# Patient Record
Sex: Male | Born: 1935 | Hispanic: No | Smoking: Former smoker
Health system: Southern US, Community
[De-identification: ages and names within clinical notes are randomized; demographics above are authoritative.]

## PROBLEM LIST (undated history)

## (undated) DIAGNOSIS — J449 Chronic obstructive pulmonary disease, unspecified: Secondary | ICD-10-CM

## (undated) DIAGNOSIS — E119 Type 2 diabetes mellitus without complications: Secondary | ICD-10-CM

## (undated) HISTORY — PX: FOOT SURGERY: SHX648

---

## 2005-09-30 ENCOUNTER — Ambulatory Visit: Payer: Self-pay | Admitting: Internal Medicine

## 2005-11-03 ENCOUNTER — Emergency Department: Payer: Self-pay | Admitting: Emergency Medicine

## 2005-12-18 ENCOUNTER — Inpatient Hospital Stay: Payer: Self-pay | Admitting: Internal Medicine

## 2005-12-18 ENCOUNTER — Other Ambulatory Visit: Payer: Self-pay

## 2006-03-02 ENCOUNTER — Ambulatory Visit: Payer: Self-pay | Admitting: Oncology

## 2006-03-18 ENCOUNTER — Ambulatory Visit (HOSPITAL_COMMUNITY): Admission: RE | Admit: 2006-03-18 | Discharge: 2006-03-18 | Payer: Self-pay | Admitting: Internal Medicine

## 2006-03-26 LAB — CBC WITH DIFFERENTIAL (CANCER CENTER ONLY)
HGB: 14.8 g/dL (ref 13.0–17.1)
LYMPH#: 1 10*3/uL (ref 0.9–3.3)
MCHC: 34.9 g/dL (ref 32.0–35.9)
MCV: 101 fL — ABNORMAL HIGH (ref 82–98)
MONO#: 0.9 10*3/uL (ref 0.1–0.9)
MONO%: 6.7 % (ref 0.0–13.0)
NEUT%: 84.3 % — ABNORMAL HIGH (ref 40.0–80.0)
Platelets: 111 10*3/uL — ABNORMAL LOW (ref 145–400)
RDW: 11.8 % (ref 10.5–14.6)
WBC: 14.1 10*3/uL — ABNORMAL HIGH (ref 4.0–10.0)

## 2006-04-02 LAB — CBC WITH DIFFERENTIAL (CANCER CENTER ONLY)
Eosinophils Absolute: 0.1 10*3/uL (ref 0.0–0.5)
MONO#: 0.5 10*3/uL (ref 0.1–0.9)
MONO%: 5.4 % (ref 0.0–13.0)
NEUT#: 8.1 10*3/uL — ABNORMAL HIGH (ref 1.5–6.5)
Platelets: 134 10*3/uL — ABNORMAL LOW (ref 145–400)
RDW: 12.5 % (ref 10.5–14.6)
WBC: 9.7 10*3/uL (ref 4.0–10.0)

## 2006-04-09 LAB — CBC WITH DIFFERENTIAL (CANCER CENTER ONLY)
BASO#: 0.1 10*3/uL (ref 0.0–0.2)
BASO%: 0.5 % (ref 0.0–2.0)
HCT: 40.2 % (ref 38.7–49.9)
HGB: 13.4 g/dL (ref 13.0–17.1)
LYMPH#: 0.7 10*3/uL — ABNORMAL LOW (ref 0.9–3.3)
LYMPH%: 6.6 % — ABNORMAL LOW (ref 14.0–48.0)
MCH: 33.7 pg — ABNORMAL HIGH (ref 28.0–33.4)
MCHC: 33.4 g/dL (ref 32.0–35.9)
MCV: 101 fL — ABNORMAL HIGH (ref 82–98)
MONO#: 0.5 10*3/uL (ref 0.1–0.9)
MONO%: 4.6 % (ref 0.0–13.0)
NEUT%: 87 % — ABNORMAL HIGH (ref 40.0–80.0)
Platelets: 95 10*3/uL — ABNORMAL LOW (ref 145–400)
RBC: 3.99 10*6/uL — ABNORMAL LOW (ref 4.20–5.70)
RDW: 12.8 % (ref 10.5–14.6)
WBC: 10.9 10*3/uL — ABNORMAL HIGH (ref 4.0–10.0)

## 2006-04-15 ENCOUNTER — Ambulatory Visit: Payer: Self-pay | Admitting: Oncology

## 2006-04-16 LAB — CBC WITH DIFFERENTIAL (CANCER CENTER ONLY)
BASO%: 1.1 % (ref 0.0–2.0)
MCHC: 34.1 g/dL (ref 32.0–35.9)
MCV: 101 fL — ABNORMAL HIGH (ref 82–98)
MONO#: 0.9 10*3/uL (ref 0.1–0.9)
NEUT#: 7 10*3/uL — ABNORMAL HIGH (ref 1.5–6.5)
NEUT%: 70.2 % (ref 40.0–80.0)

## 2006-04-23 LAB — CBC WITH DIFFERENTIAL (CANCER CENTER ONLY)
EOS%: 1.2 % (ref 0.0–7.0)
HCT: 38.7 % (ref 38.7–49.9)
HGB: 13.1 g/dL (ref 13.0–17.1)
MCH: 34.1 pg — ABNORMAL HIGH (ref 28.0–33.4)
MCHC: 33.7 g/dL (ref 32.0–35.9)
MCV: 101 fL — ABNORMAL HIGH (ref 82–98)
MONO#: 0.3 10*3/uL (ref 0.1–0.9)
NEUT#: 6.7 10*3/uL — ABNORMAL HIGH (ref 1.5–6.5)
NEUT%: 81.4 % — ABNORMAL HIGH (ref 40.0–80.0)
RDW: 12.3 % (ref 10.5–14.6)
WBC: 8.2 10*3/uL (ref 4.0–10.0)

## 2006-04-30 LAB — CBC WITH DIFFERENTIAL (CANCER CENTER ONLY)
Eosinophils Absolute: 0.1 10*3/uL (ref 0.0–0.5)
HGB: 12.8 g/dL — ABNORMAL LOW (ref 13.0–17.1)
LYMPH#: 1.1 10*3/uL (ref 0.9–3.3)
LYMPH%: 17 % (ref 14.0–48.0)
MONO#: 0.3 10*3/uL (ref 0.1–0.9)
MONO%: 3.8 % (ref 0.0–13.0)
RDW: 12.5 % (ref 10.5–14.6)
WBC: 6.6 10*3/uL (ref 4.0–10.0)

## 2006-04-30 LAB — IRON AND TIBC
Iron: 92 ug/dL (ref 42–165)
UIBC: 178 ug/dL

## 2006-04-30 LAB — FERRITIN: Ferritin: 279 ng/mL (ref 22–322)

## 2006-04-30 LAB — VITAMIN B12: Vitamin B-12: 558 pg/mL (ref 211–911)

## 2006-05-07 LAB — CBC WITH DIFFERENTIAL (CANCER CENTER ONLY)
BASO#: 0.1 10*3/uL (ref 0.0–0.2)
EOS%: 0.9 % (ref 0.0–7.0)
Eosinophils Absolute: 0.1 10*3/uL (ref 0.0–0.5)
HCT: 41 % (ref 38.7–49.9)
LYMPH%: 8.7 % — ABNORMAL LOW (ref 14.0–48.0)
MCH: 34 pg — ABNORMAL HIGH (ref 28.0–33.4)
MCV: 100 fL — ABNORMAL HIGH (ref 82–98)
NEUT#: 8.5 10*3/uL — ABNORMAL HIGH (ref 1.5–6.5)
RDW: 12 % (ref 10.5–14.6)

## 2006-05-14 LAB — CBC WITH DIFFERENTIAL (CANCER CENTER ONLY)
EOS%: 1.2 % (ref 0.0–7.0)
Eosinophils Absolute: 0.1 10*3/uL (ref 0.0–0.5)
HCT: 41.3 % (ref 38.7–49.9)
MCHC: 33.2 g/dL (ref 32.0–35.9)
MONO#: 0.3 10*3/uL (ref 0.1–0.9)
NEUT#: 9 10*3/uL — ABNORMAL HIGH (ref 1.5–6.5)
WBC: 10.4 10*3/uL — ABNORMAL HIGH (ref 4.0–10.0)

## 2006-05-21 LAB — CBC WITH DIFFERENTIAL (CANCER CENTER ONLY)
BASO#: 0 10*3/uL (ref 0.0–0.2)
BASO%: 0.4 % (ref 0.0–2.0)
EOS%: 1 % (ref 0.0–7.0)
Eosinophils Absolute: 0.1 10*3/uL (ref 0.0–0.5)
LYMPH#: 0.7 10*3/uL — ABNORMAL LOW (ref 0.9–3.3)
MCH: 34.7 pg — ABNORMAL HIGH (ref 28.0–33.4)
MCHC: 33.3 g/dL (ref 32.0–35.9)
MCV: 104 fL — ABNORMAL HIGH (ref 82–98)
MONO%: 3.2 % (ref 0.0–13.0)
NEUT%: 87.8 % — ABNORMAL HIGH (ref 40.0–80.0)

## 2006-05-28 LAB — CBC WITH DIFFERENTIAL (CANCER CENTER ONLY)
BASO#: 0.1 10e3/uL (ref 0.0–0.2)
BASO%: 0.5 % (ref 0.0–2.0)
EOS%: 1.1 % (ref 0.0–7.0)
Eosinophils Absolute: 0.1 10e3/uL (ref 0.0–0.5)
HCT: 42.1 % (ref 38.7–49.9)
HGB: 13.8 g/dL (ref 13.0–17.1)
LYMPH#: 0.9 10e3/uL (ref 0.9–3.3)
LYMPH%: 8.5 % — ABNORMAL LOW (ref 14.0–48.0)
MCH: 33.1 pg (ref 28.0–33.4)
MCHC: 32.9 g/dL (ref 32.0–35.9)
MCV: 100 fL — ABNORMAL HIGH (ref 82–98)
MONO#: 0.5 10e3/uL (ref 0.1–0.9)
MONO%: 4.9 % (ref 0.0–13.0)
NEUT#: 9.1 10e3/uL — ABNORMAL HIGH (ref 1.5–6.5)
NEUT%: 85 % — ABNORMAL HIGH (ref 40.0–80.0)
Platelets: 152 10e3/uL (ref 145–400)
RBC: 4.19 10e6/uL — ABNORMAL LOW (ref 4.20–5.70)
RDW: 11.9 % (ref 10.5–14.6)
WBC: 10.7 10e3/uL — ABNORMAL HIGH (ref 4.0–10.0)

## 2006-06-03 ENCOUNTER — Ambulatory Visit: Payer: Self-pay | Admitting: Oncology

## 2006-06-04 LAB — CBC WITH DIFFERENTIAL (CANCER CENTER ONLY)
BASO#: 0 10e3/uL (ref 0.0–0.2)
BASO%: 0.4 % (ref 0.0–2.0)
EOS%: 1.2 % (ref 0.0–7.0)
Eosinophils Absolute: 0.1 10e3/uL (ref 0.0–0.5)
HCT: 40.4 % (ref 38.7–49.9)
HGB: 13.4 g/dL (ref 13.0–17.1)
LYMPH#: 0.7 10e3/uL — ABNORMAL LOW (ref 0.9–3.3)
LYMPH%: 8.1 % — ABNORMAL LOW (ref 14.0–48.0)
MCH: 32.9 pg (ref 28.0–33.4)
MCHC: 33.1 g/dL (ref 32.0–35.9)
MCV: 99 fL — ABNORMAL HIGH (ref 82–98)
MONO#: 0.4 10e3/uL (ref 0.1–0.9)
MONO%: 4.3 % (ref 0.0–13.0)
NEUT#: 7.4 10e3/uL — ABNORMAL HIGH (ref 1.5–6.5)
NEUT%: 86 % — ABNORMAL HIGH (ref 40.0–80.0)
Platelets: 163 10e3/uL (ref 145–400)
RBC: 4.06 10e6/uL — ABNORMAL LOW (ref 4.20–5.70)
RDW: 12.3 % (ref 10.5–14.6)
WBC: 8.6 10e3/uL (ref 4.0–10.0)

## 2006-06-12 LAB — CBC WITH DIFFERENTIAL (CANCER CENTER ONLY)
BASO#: 0 10*3/uL (ref 0.0–0.2)
Eosinophils Absolute: 0.1 10*3/uL (ref 0.0–0.5)
HCT: 40 % (ref 38.7–49.9)
LYMPH%: 8.5 % — ABNORMAL LOW (ref 14.0–48.0)
MCHC: 33.1 g/dL (ref 32.0–35.9)
MCV: 99 fL — ABNORMAL HIGH (ref 82–98)
MONO#: 0.4 10*3/uL (ref 0.1–0.9)
MONO%: 4.7 % (ref 0.0–13.0)
NEUT#: 6.7 10*3/uL — ABNORMAL HIGH (ref 1.5–6.5)
Platelets: 174 10*3/uL (ref 145–400)
RDW: 11.4 % (ref 10.5–14.6)
WBC: 7.8 10*3/uL (ref 4.0–10.0)

## 2006-06-13 LAB — IRON AND TIBC: UIBC: 222 ug/dL

## 2006-06-19 LAB — CBC WITH DIFFERENTIAL (CANCER CENTER ONLY)
BASO#: 0.1 10*3/uL (ref 0.0–0.2)
BASO%: 0.7 % (ref 0.0–2.0)
EOS%: 2 % (ref 0.0–7.0)
Eosinophils Absolute: 0.2 10*3/uL (ref 0.0–0.5)
HCT: 39.9 % (ref 38.7–49.9)
HGB: 13.5 g/dL (ref 13.0–17.1)
MCV: 98 fL (ref 82–98)
NEUT#: 7.6 10*3/uL — ABNORMAL HIGH (ref 1.5–6.5)
Platelets: 181 10*3/uL (ref 145–400)
RBC: 4.06 10*6/uL — ABNORMAL LOW (ref 4.20–5.70)
RDW: 12.2 % (ref 10.5–14.6)
WBC: 9.8 10*3/uL (ref 4.0–10.0)

## 2006-07-02 LAB — CBC WITH DIFFERENTIAL (CANCER CENTER ONLY)
Eosinophils Absolute: 0.2 10*3/uL (ref 0.0–0.5)
HCT: 39.6 % (ref 38.7–49.9)
LYMPH#: 0.8 10*3/uL — ABNORMAL LOW (ref 0.9–3.3)
MONO#: 0.5 10*3/uL (ref 0.1–0.9)
Platelets: 174 10*3/uL (ref 145–400)

## 2006-07-09 LAB — CBC WITH DIFFERENTIAL (CANCER CENTER ONLY)
BASO#: 0.1 10*3/uL (ref 0.0–0.2)
EOS%: 1.1 % (ref 0.0–7.0)
HCT: 38.5 % — ABNORMAL LOW (ref 38.7–49.9)
HGB: 12.8 g/dL — ABNORMAL LOW (ref 13.0–17.1)
MCH: 31.4 pg (ref 28.0–33.4)
MCV: 95 fL (ref 82–98)
RBC: 4.07 10*6/uL — ABNORMAL LOW (ref 4.20–5.70)

## 2006-07-16 LAB — CBC WITH DIFFERENTIAL (CANCER CENTER ONLY)
BASO#: 0.1 10*3/uL (ref 0.0–0.2)
BASO%: 0.9 % (ref 0.0–2.0)
LYMPH#: 1 10*3/uL (ref 0.9–3.3)
MCHC: 33.2 g/dL (ref 32.0–35.9)
MCV: 93 fL (ref 82–98)
MONO#: 0.4 10*3/uL (ref 0.1–0.9)
MONO%: 3.5 % (ref 0.0–13.0)
NEUT#: 9.7 10*3/uL — ABNORMAL HIGH (ref 1.5–6.5)
Platelets: 187 10*3/uL (ref 145–400)
RBC: 4 10*6/uL — ABNORMAL LOW (ref 4.20–5.70)
RDW: 12.5 % (ref 10.5–14.6)
WBC: 11.4 10*3/uL — ABNORMAL HIGH (ref 4.0–10.0)

## 2006-07-21 ENCOUNTER — Ambulatory Visit: Payer: Self-pay | Admitting: Oncology

## 2006-07-23 LAB — CBC WITH DIFFERENTIAL (CANCER CENTER ONLY)
BASO#: 0.1 10*3/uL (ref 0.0–0.2)
EOS%: 1.2 % (ref 0.0–7.0)
HCT: 34.4 % — ABNORMAL LOW (ref 38.7–49.9)
LYMPH#: 0.6 10*3/uL — ABNORMAL LOW (ref 0.9–3.3)
LYMPH%: 6.9 % — ABNORMAL LOW (ref 14.0–48.0)
MCHC: 33 g/dL (ref 32.0–35.9)
NEUT#: 8.1 10*3/uL — ABNORMAL HIGH (ref 1.5–6.5)
NEUT%: 88.2 % — ABNORMAL HIGH (ref 40.0–80.0)
RBC: 3.76 10*6/uL — ABNORMAL LOW (ref 4.20–5.70)
WBC: 9.1 10*3/uL (ref 4.0–10.0)

## 2006-07-24 LAB — FERRITIN: Ferritin: 42 ng/mL (ref 22–322)

## 2006-07-30 LAB — CBC WITH DIFFERENTIAL (CANCER CENTER ONLY)
BASO%: 0.4 % (ref 0.0–2.0)
EOS%: 1.2 % (ref 0.0–7.0)
MCHC: 33 g/dL (ref 32.0–35.9)
NEUT#: 5.7 10*3/uL (ref 1.5–6.5)
NEUT%: 86.1 % — ABNORMAL HIGH (ref 40.0–80.0)
WBC: 6.6 10*3/uL (ref 4.0–10.0)

## 2006-09-08 ENCOUNTER — Ambulatory Visit: Payer: Self-pay | Admitting: Oncology

## 2006-09-10 LAB — CBC WITH DIFFERENTIAL (CANCER CENTER ONLY)
BASO#: 0 10*3/uL (ref 0.0–0.2)
EOS%: 1.2 % (ref 0.0–7.0)
Eosinophils Absolute: 0.1 10*3/uL (ref 0.0–0.5)
HGB: 11.9 g/dL — ABNORMAL LOW (ref 13.0–17.1)
LYMPH#: 0.8 10*3/uL — ABNORMAL LOW (ref 0.9–3.3)
MONO#: 0.5 10*3/uL (ref 0.1–0.9)
NEUT#: 9.1 10*3/uL — ABNORMAL HIGH (ref 1.5–6.5)
NEUT%: 86.5 % — ABNORMAL HIGH (ref 40.0–80.0)
Platelets: 171 10*3/uL (ref 145–400)
RDW: 15 % — ABNORMAL HIGH (ref 10.5–14.6)

## 2006-09-24 LAB — CBC WITH DIFFERENTIAL (CANCER CENTER ONLY)
EOS%: 1.5 % (ref 0.0–7.0)
Eosinophils Absolute: 0.1 10*3/uL (ref 0.0–0.5)
HGB: 11.7 g/dL — ABNORMAL LOW (ref 13.0–17.1)
MONO#: 0.4 10*3/uL (ref 0.1–0.9)
RDW: 14.5 % (ref 10.5–14.6)

## 2006-10-02 ENCOUNTER — Ambulatory Visit: Payer: Self-pay | Admitting: Internal Medicine

## 2006-10-12 LAB — CBC WITH DIFFERENTIAL (CANCER CENTER ONLY)
BASO%: 0.3 % (ref 0.0–2.0)
EOS%: 1.5 % (ref 0.0–7.0)
LYMPH%: 8.5 % — ABNORMAL LOW (ref 14.0–48.0)
MCH: 27.2 pg — ABNORMAL LOW (ref 28.0–33.4)
MCV: 84 fL (ref 82–98)
RBC: 4.71 10*6/uL (ref 4.20–5.70)
RDW: 15.3 % — ABNORMAL HIGH (ref 10.5–14.6)
WBC: 9.5 10*3/uL (ref 4.0–10.0)

## 2006-10-12 LAB — IRON AND TIBC: Iron: 70 ug/dL (ref 42–165)

## 2007-01-01 ENCOUNTER — Ambulatory Visit: Payer: Self-pay | Admitting: Oncology

## 2007-01-04 LAB — CBC WITH DIFFERENTIAL (CANCER CENTER ONLY)
BASO#: 0 10*3/uL (ref 0.0–0.2)
Eosinophils Absolute: 0.1 10*3/uL (ref 0.0–0.5)
HGB: 13 g/dL (ref 13.0–17.1)
MCHC: 32.8 g/dL (ref 32.0–35.9)
NEUT%: 87.1 % — ABNORMAL HIGH (ref 40.0–80.0)
Platelets: 161 10*3/uL (ref 145–400)

## 2007-01-04 LAB — IRON AND TIBC
%SAT: 13 % — ABNORMAL LOW (ref 20–55)
Iron: 42 ug/dL (ref 42–165)
TIBC: 315 ug/dL (ref 215–435)
UIBC: 273 ug/dL

## 2007-01-04 LAB — FERRITIN: Ferritin: 39 ng/mL (ref 22–322)

## 2007-02-07 ENCOUNTER — Other Ambulatory Visit: Payer: Self-pay

## 2007-02-07 ENCOUNTER — Emergency Department: Payer: Self-pay | Admitting: Internal Medicine

## 2007-03-26 ENCOUNTER — Ambulatory Visit: Payer: Self-pay | Admitting: Oncology

## 2007-09-14 IMAGING — CT CT NECK-CHEST W/ CM
1 series · 12 of 14 positions shown, 15 images · IV contrast (APPLIED)
Comparison: none

REASON FOR EXAM: weakness, chest pain
COMMENTS:

[Series 4: soft tissue · axial · 0.74mm/px · z∈[+60,+423]mm · 12 of 145 slices shown, 15 images]
[im 12/145  soft-tissue]
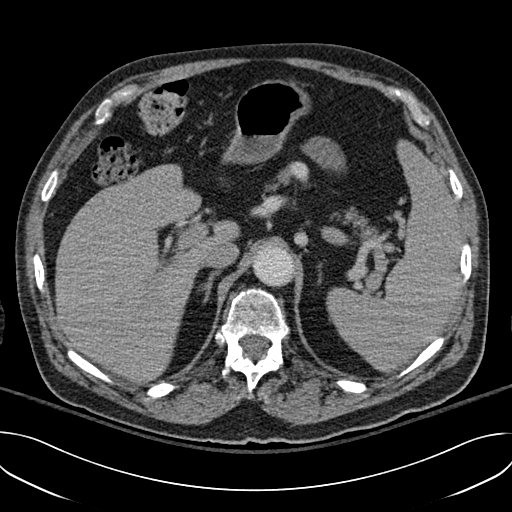
[im 12/145  bone]
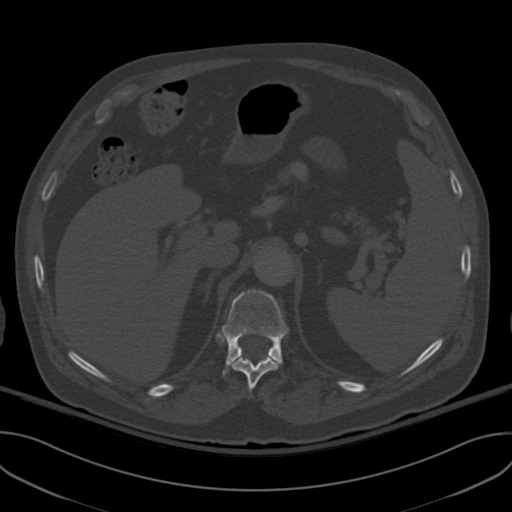
[im 23/145  bone]
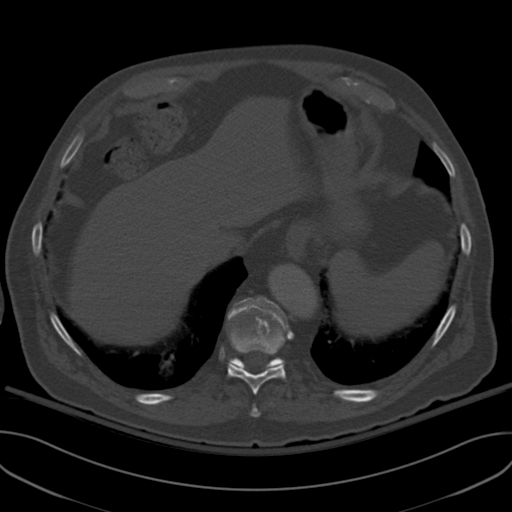
[im 34/145  bone]
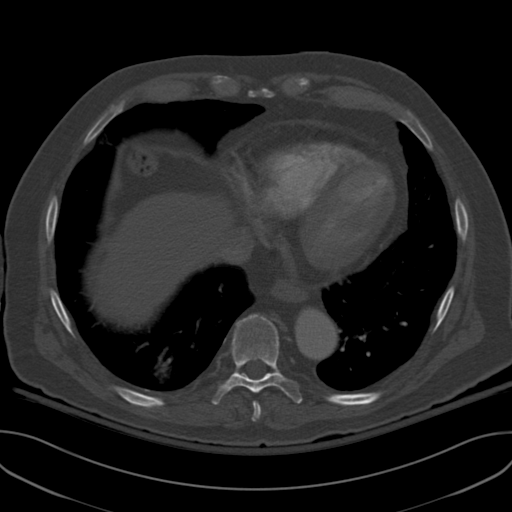
[im 45/145  bone]
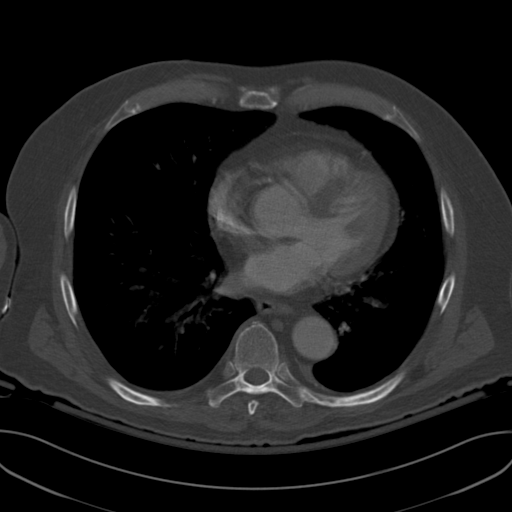
[im 56/145  soft-tissue]
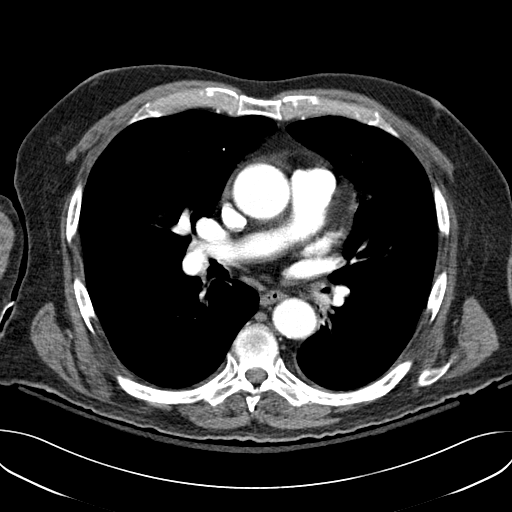
[im 56/145  bone]
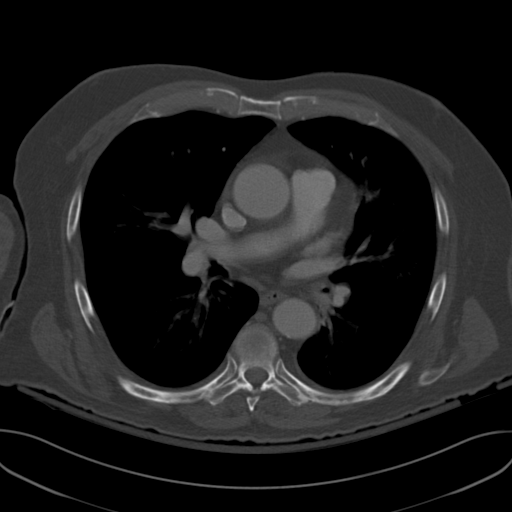
[im 67/145  bone]
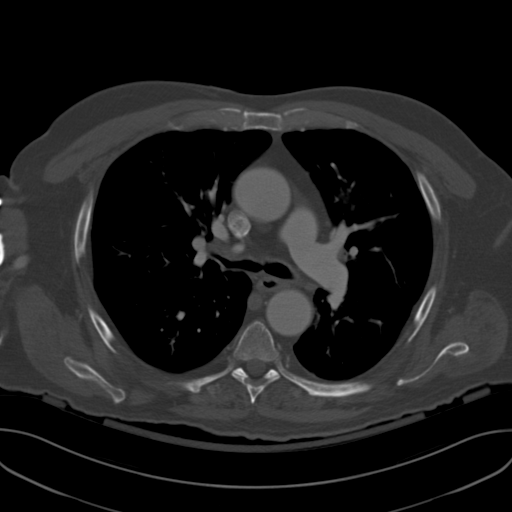
[im 78/145  bone]
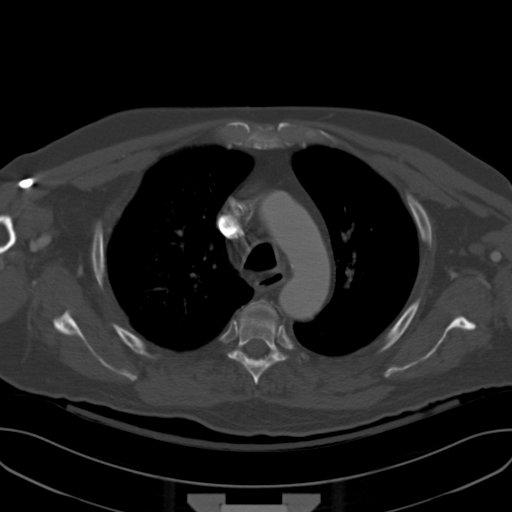
[im 89/145  bone]
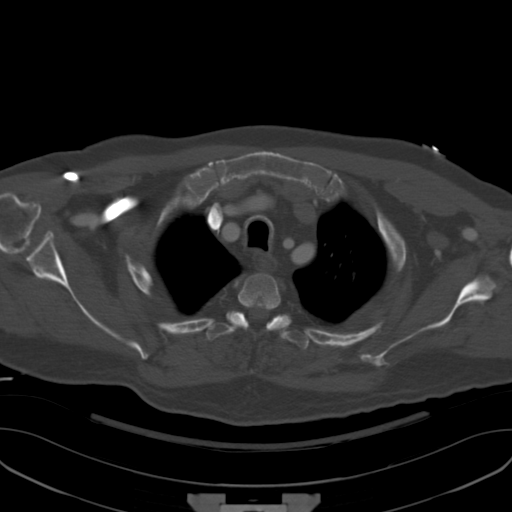
[im 100/145  soft-tissue]
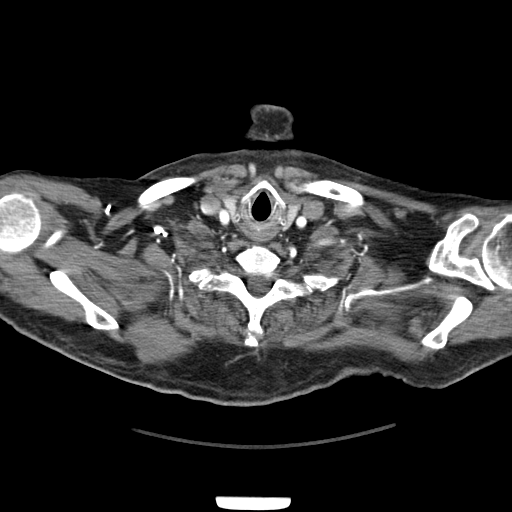
[im 100/145  bone]
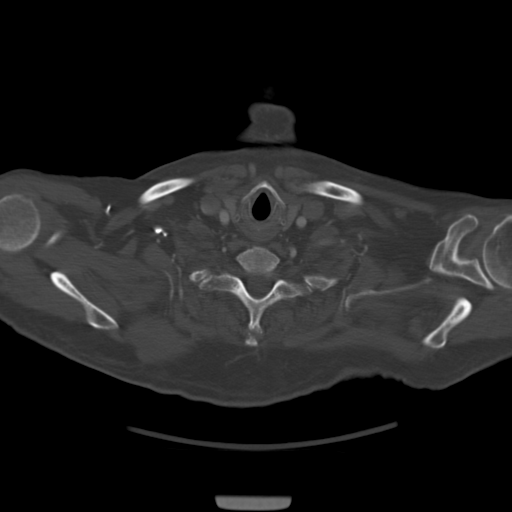
[im 111/145  bone]
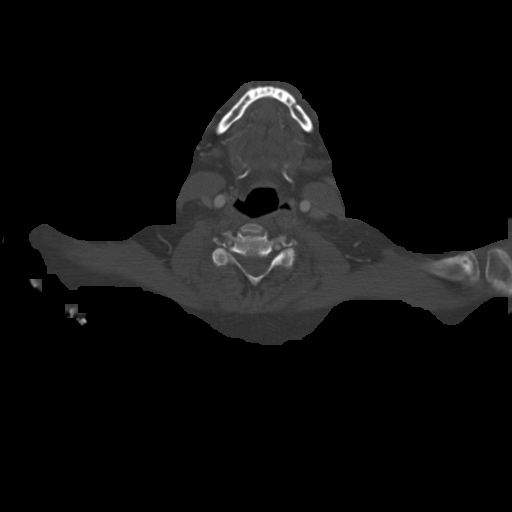
[im 122/145  bone]
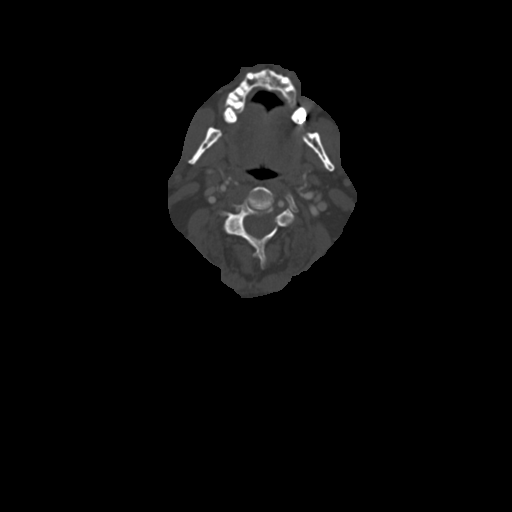
[im 133/145  bone]
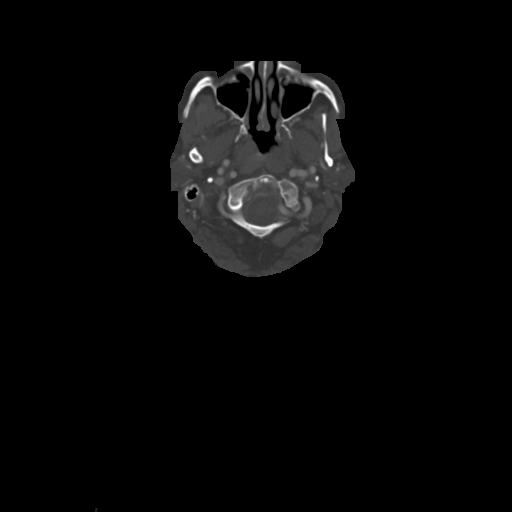

[12 of 14 positions shown; findings below may reference images not displayed]

PROCEDURE:     CT  - CT NECK AND CHEST W  - December 18, 2005  [DATE]

RESULT:

CT OF THE CHEST: CT of the chest and neck is performed. The patient
previously had an attempted chest CT for PE, but apparently the IV became
disconnected and the contrast bolus was suboptimal.  A second study
demonstrates optimal contrast opacification of the pulmonary arterial
system.  Unfortunately, there is fairly significant respiratory motion
artifact, which decreases sensitivity for evaluation for PE.  No definite
filling defects are identified within the pulmonary arteries. There are
scattered areas of atelectasis.  There is no significant effusion. There is
no pneumothorax. There is no pericardial effusion. The heart is not
enlarged. The aorta appears to be normal. There does appear to be some
fairly heavy atherosclerotic calcification in the coronary arteries.

CT SCAN OF THE NECK:  IV contrast enhanced CT scan of the neck performed at
the same time of the chest shows some degenerative changes in the cervical
spine, which could be further evaluated with plain films or MR if the
patient is symptomatic.  There is no evidence of adenopathy or focal mass.
The salivary glands appear to be unremarkable. There is some opacification
of the LEFT sphenoid sinus and posterior LEFT ethmoid air cells, which may
represent sinusitis.  The frontal and maxillary sinuses are normally
aerated.
IMPRESSION: 1)Limited sensitivity because of respiratory motion artifact.

2)No definite PE.

3)No mediastinal mass.

4)LEFT sphenoid and ethmoid sinusitis.  No other significant abnormality in
the neck aside from the degenerative changes in the cervical spine.

## 2007-09-14 IMAGING — CR DG CHEST 2V
1 series · 3 of 3 positions shown · non-contrast
Comparison: none

REASON FOR EXAM: Weakness. Rm 18
COMMENTS:  LMP: (Male)

[Series 1: view not recorded · 0.17mm/px · 3 of 3 slices shown]
[im 1/3]
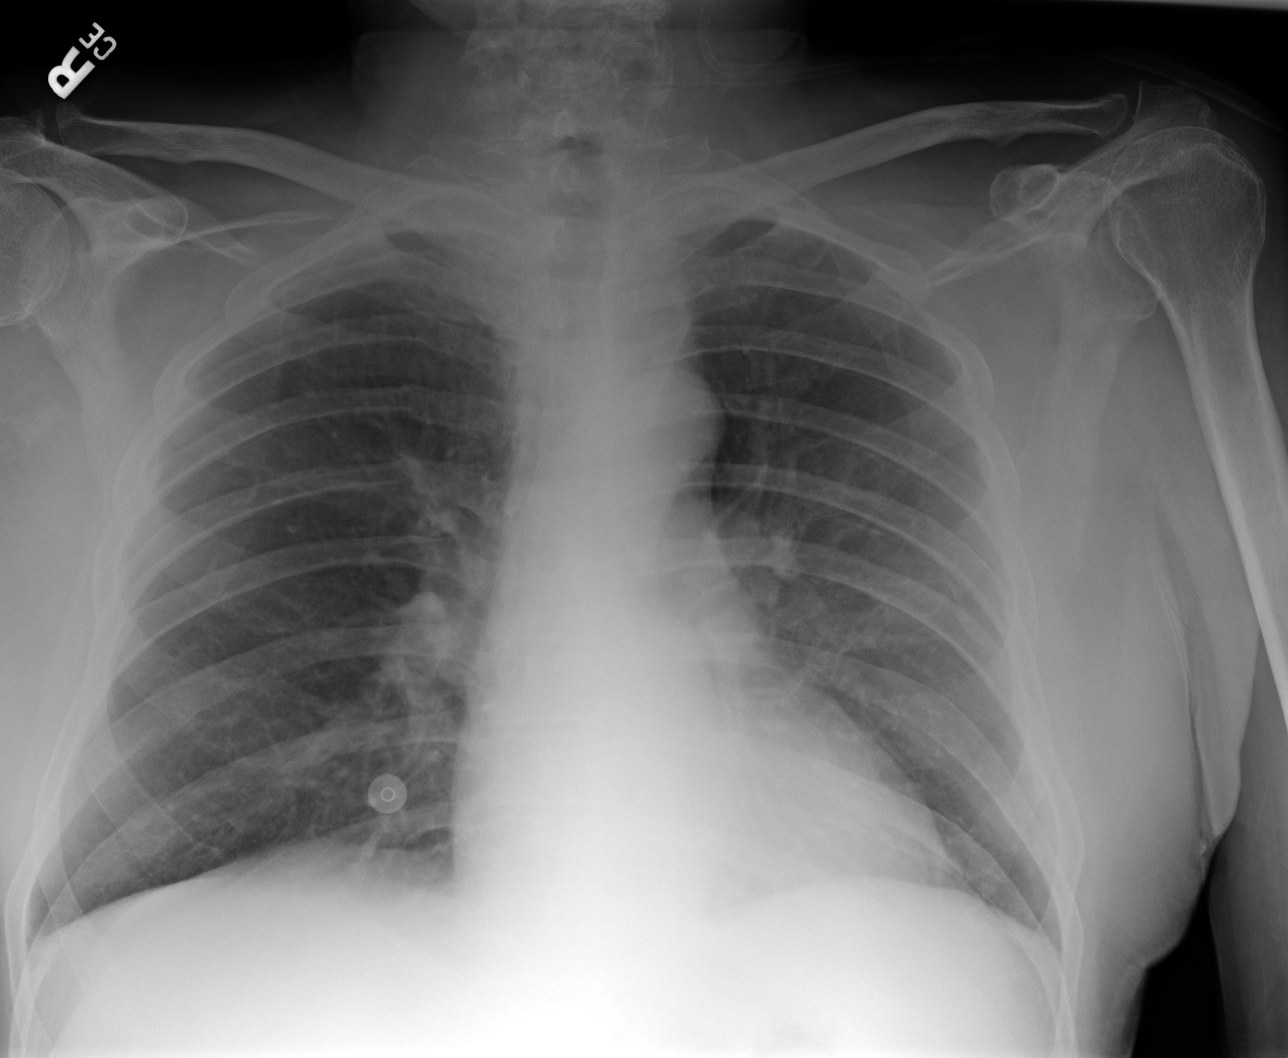
[im 2/3]
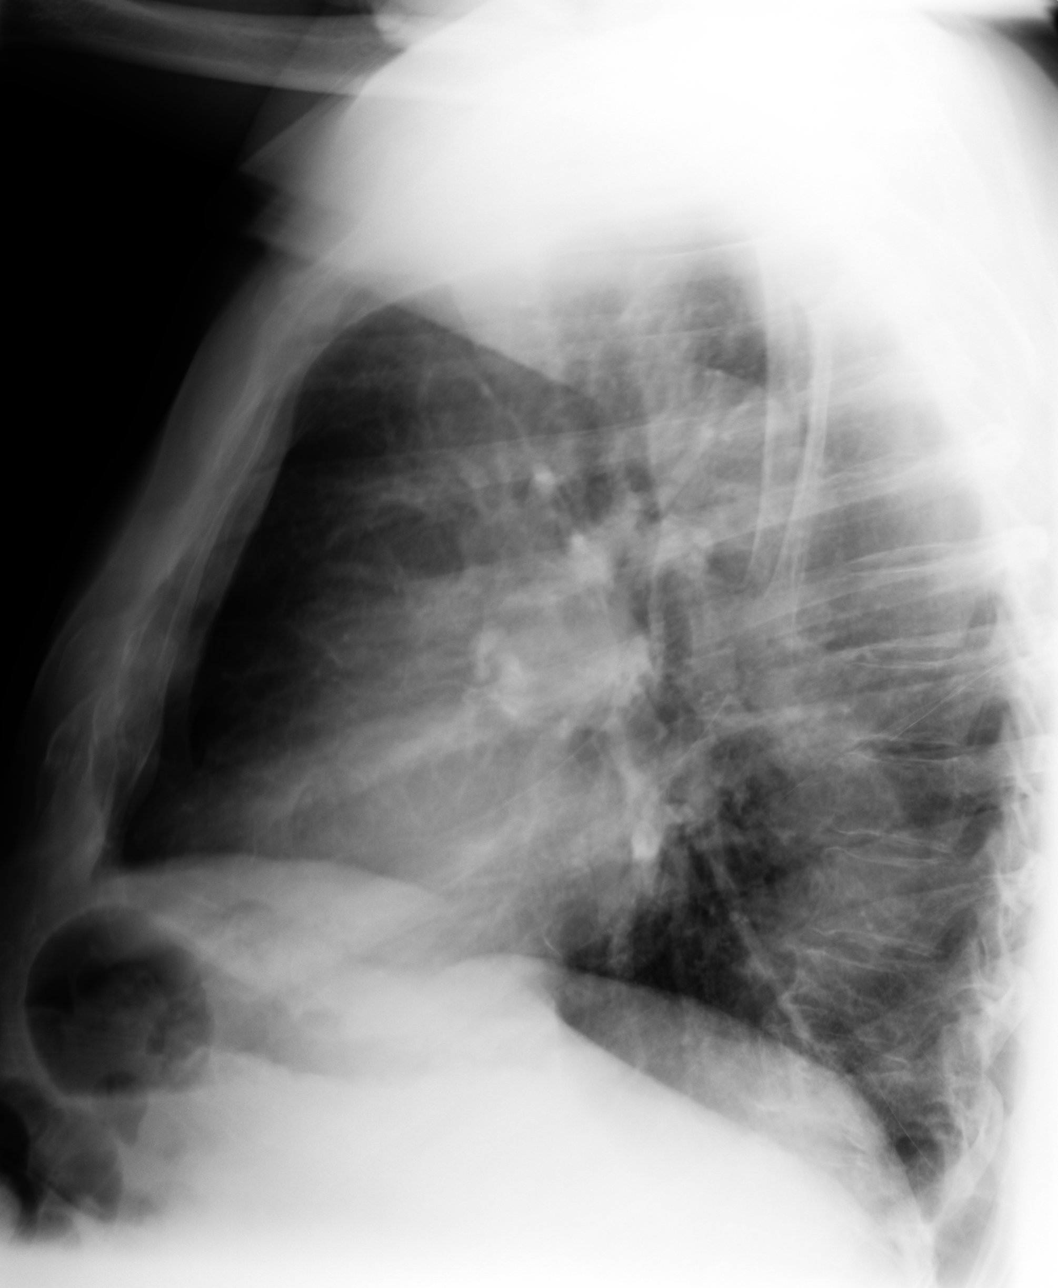
[im 3/3]
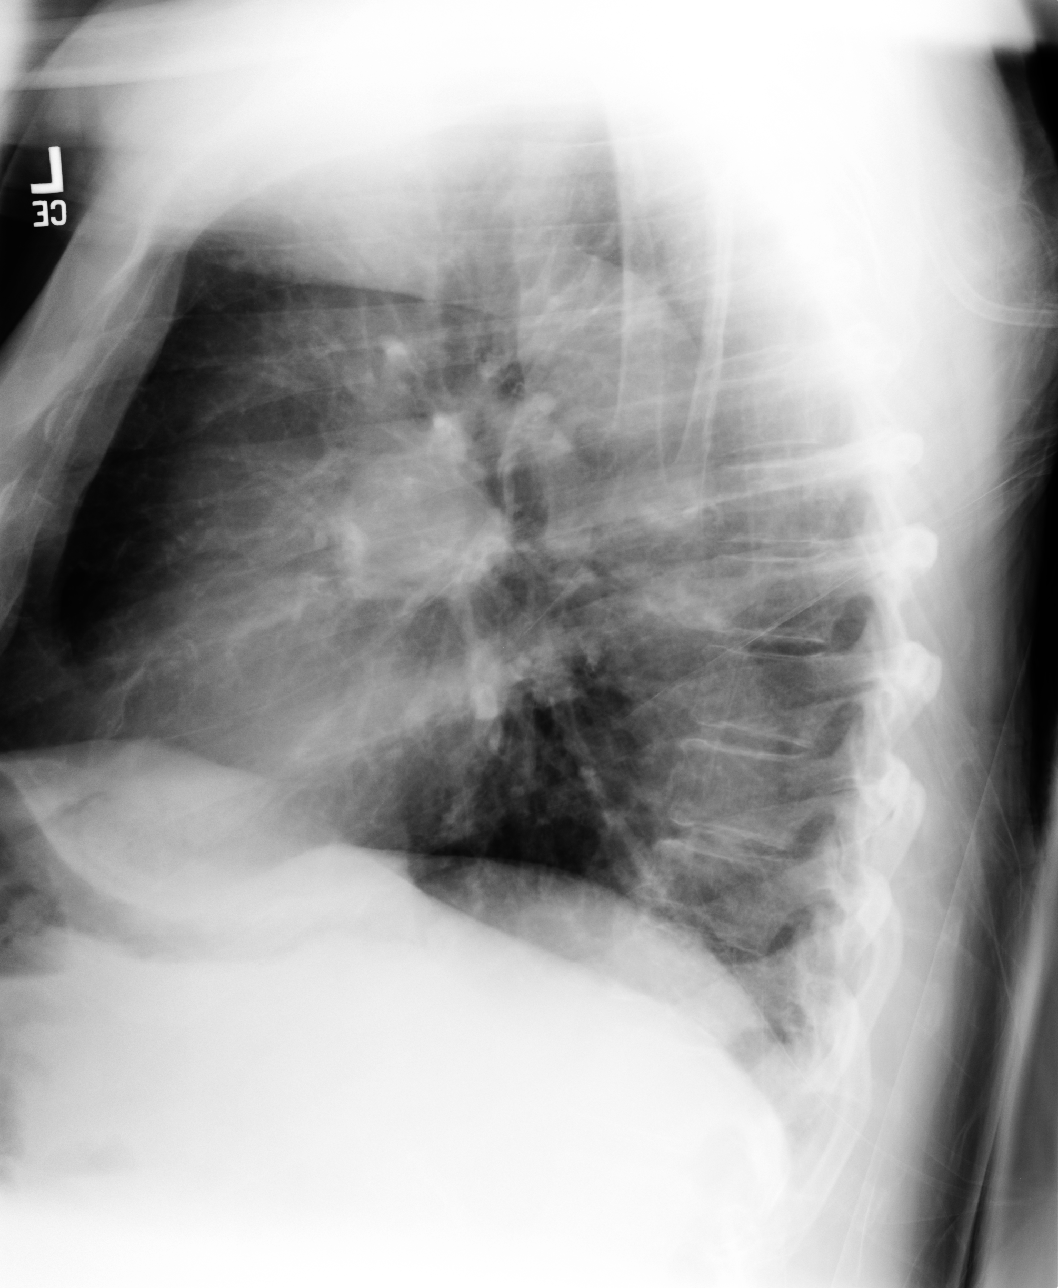

[3 of 3 positions shown; findings below may reference images not displayed]

PROCEDURE:     DXR - DXR CHEST PA (OR AP) AND LATERAL  - December 18, 2005  [DATE]

RESULT:     The patient has no prior study for comparison.  The lungs are
hyperinflated consistent with COPD.  There is no infiltrate or effusion.
There is no pneumothorax.  There is some mild hilar prominence which may be
secondary to prominent pulmonary vasculature.  Some granulomatous
calcifications in the hilar regions cannot be excluded.  Does the patient
have prior chest x-rays at another institution which can be compared?
IMPRESSION: Mild hilar prominence which may be secondary to prominent pulmonary
vasculature.  Is there a history of pulmonary hypertension?

Emphysematous changes.

No cardiomegaly.

Mild granulomatous changes likely present in the hilar region.

## 2007-12-13 IMAGING — US US ABDOMEN COMPLETE
1 series · 13 of 25 positions shown · non-contrast
Comparison: none

CLINICAL DATA: Pancreatic tail mass described on 03/04/2006 CT.   [REDACTED] also low density right renal lesion reported.  
ABDOMEN ULTRASOUND:
TECHNIQUE: Complete abdominal ultrasound examination was performed including evaluation of the liver, gallbladder, bile ducts, pancreas, kidneys, spleen, IVC, and abdominal aorta.

[Series 1: unknown · 0.35mm/px · 13 of 79 slices shown]
[im 1/79]
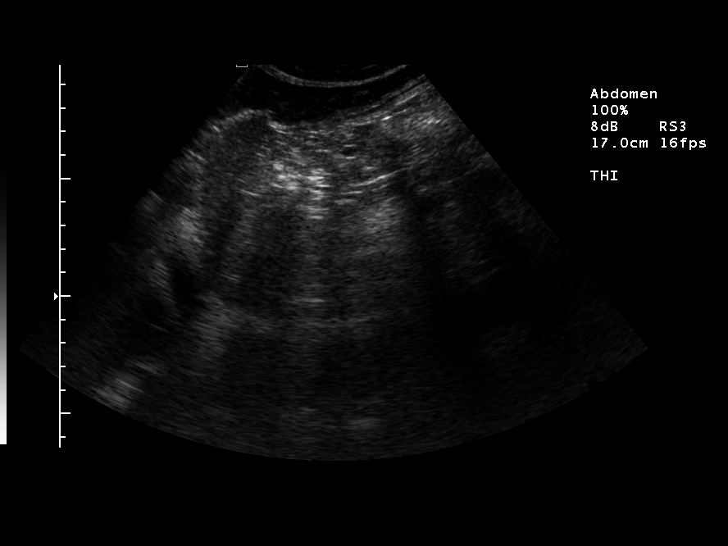
[im 7/79]
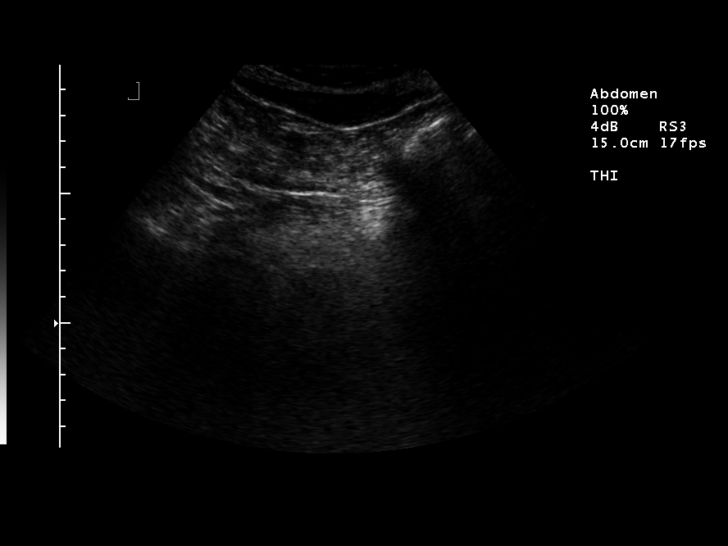
[im 14/79]
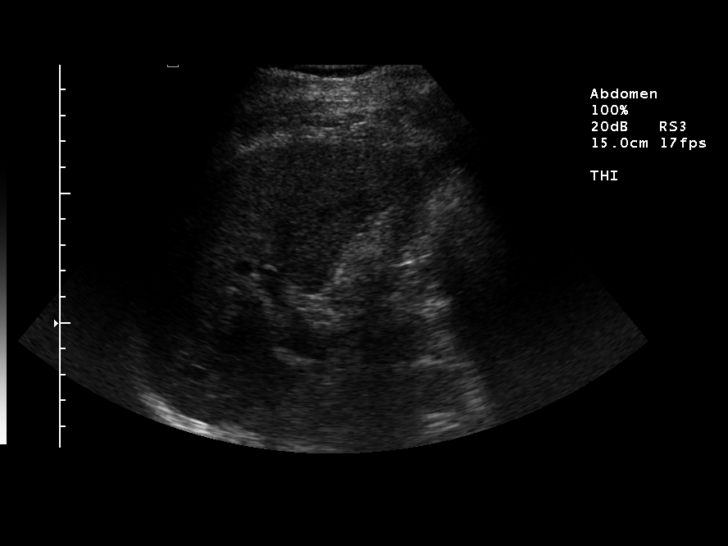
[im 20/79]
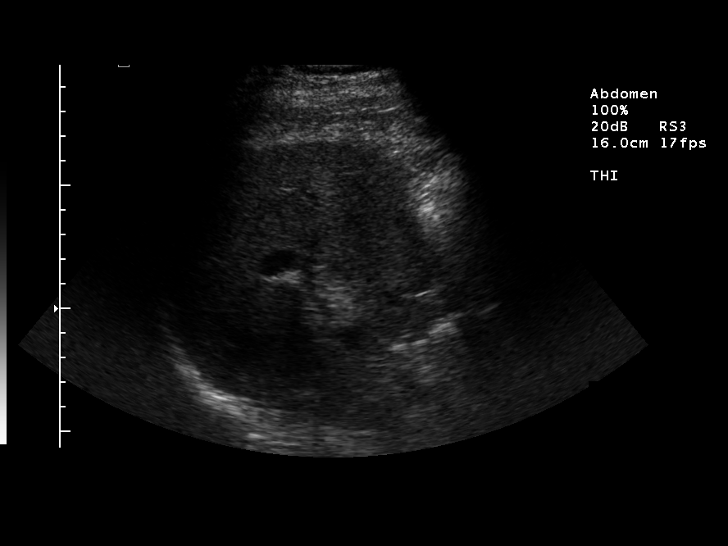
[im 27/79]
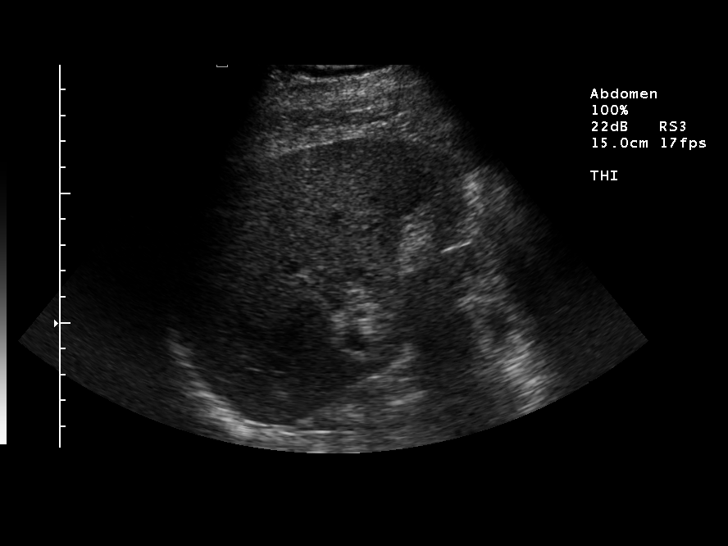
[im 33/79]
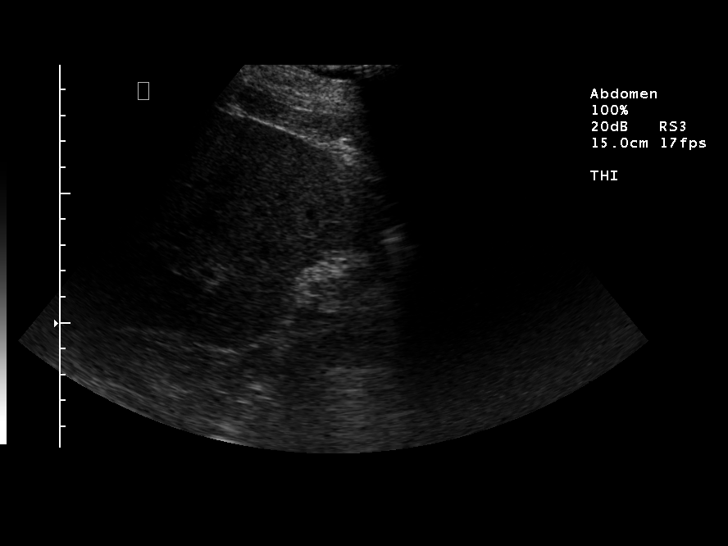
[im 40/79]
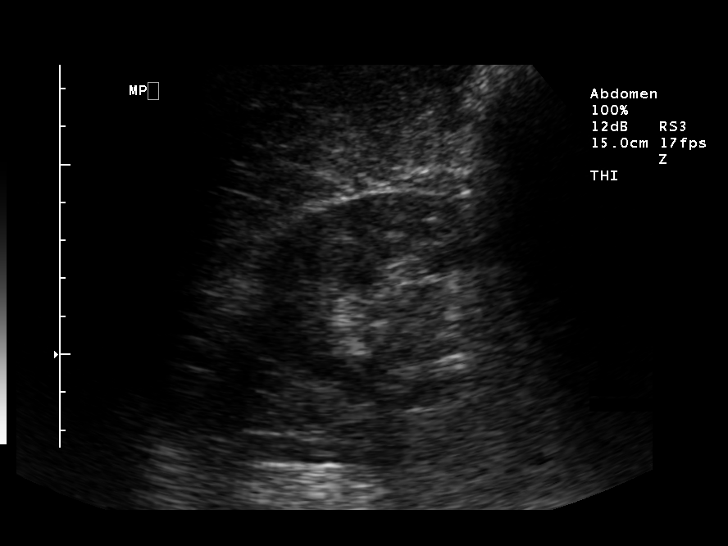
[im 46/79]
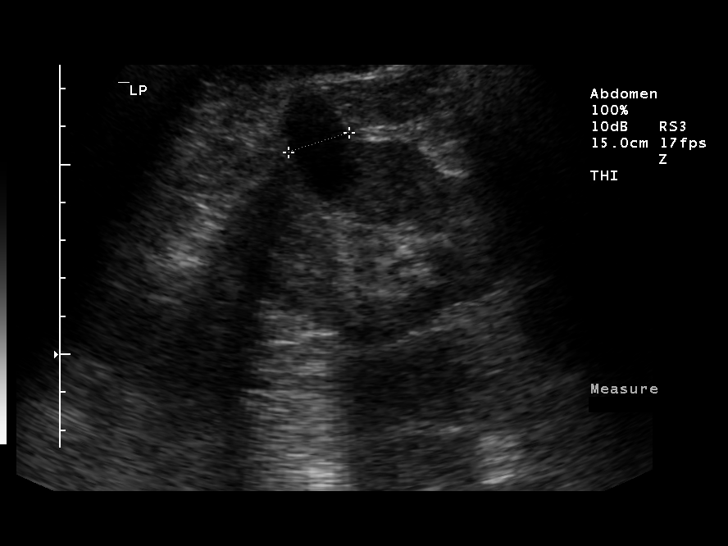
[im 53/79]
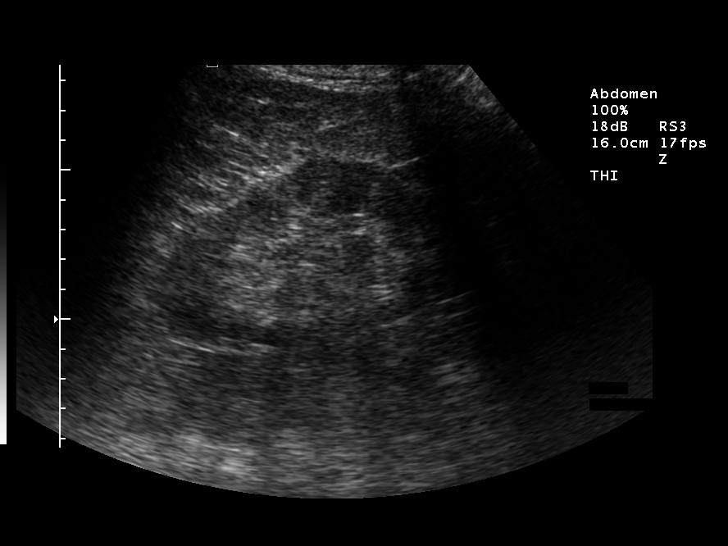
[im 59/79]
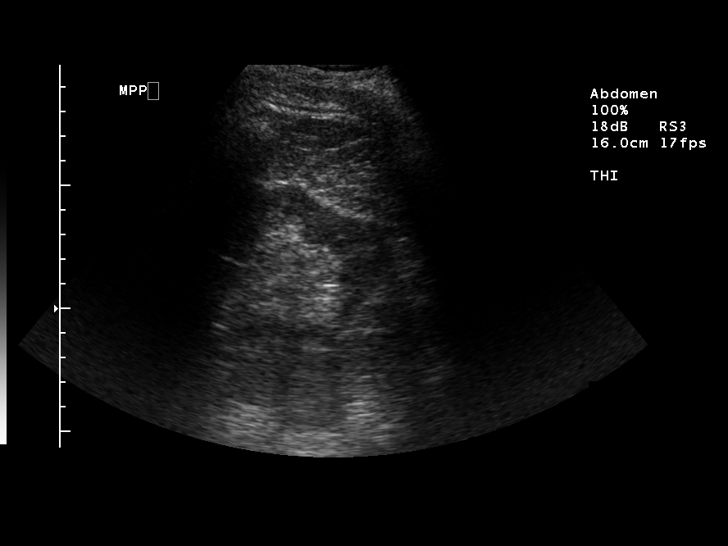
[im 66/79]
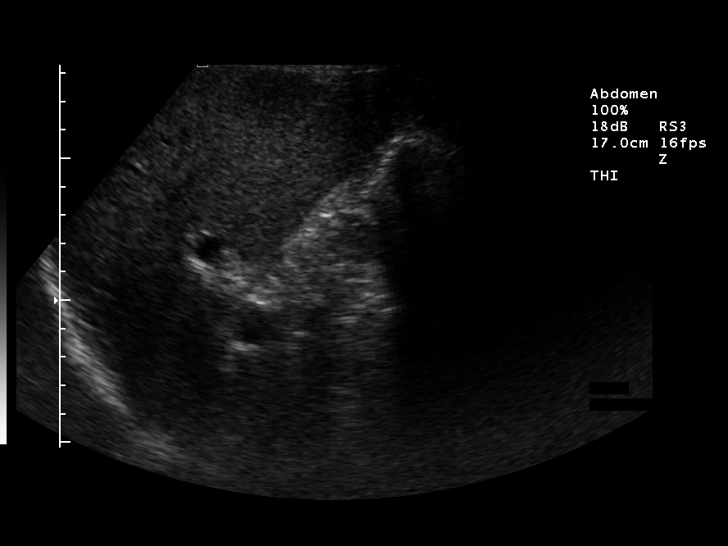
[im 72/79]
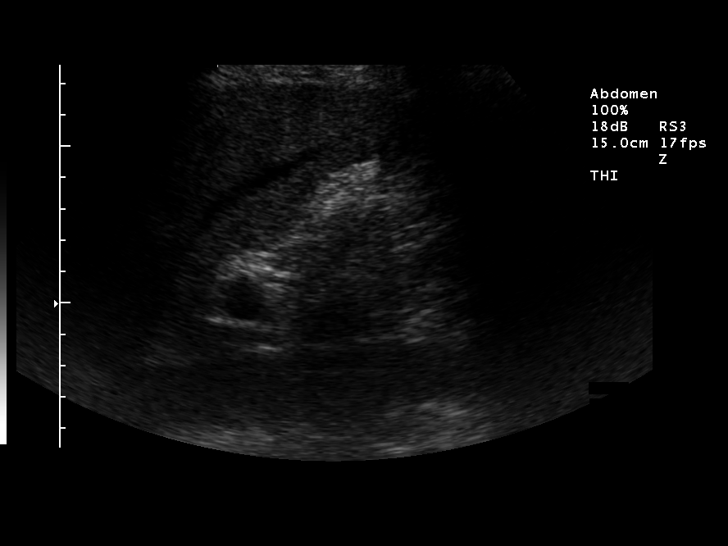
[im 79/79]
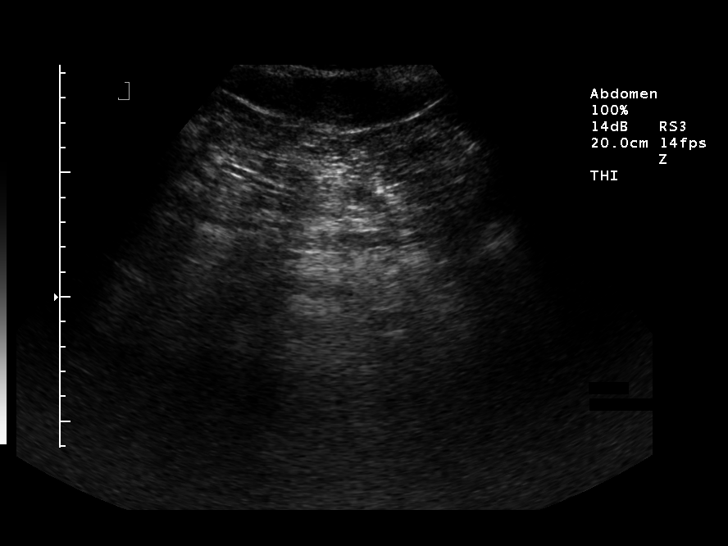

[13 of 25 positions shown; findings below may reference images not displayed]

FINDINGS: Gallbladder appears contracted and may contain stones.  Examination of the gallbladder and also of the entire abdomen is somewhat difficult in this patient due to degree of bowel gas.  Common duct caliber is 4.6 mm.  Liver measures 16.3 cm in length.  Left lobe is not well visualized due to bowel gas.  Pancreas is suboptimally visualized due to the presence of bowel gas.  On the CT scan, there was a reported 0.8 x 1.0 cm low density lesion in the pancreatic tail.  This region was obscured by adjacent bowel gas.  Stability can be assessed on followup CT.  Also reported was a 1.8 x 3.0 cm low density structure in the lateral aspect of the right kidney.  There is a cyst, I believe a slightly complex cyst at that site measuring approximately 1.5 x 1.7 x 2.9 cm.  Right and left kidneys measure 11.7 cm and 11.8 cm in length respectively.  Patent IVC.  Maximum diameter of abdominal aorta is 2.8 cm.
IMPRESSION: Retracted gallbladder probably containing stones.  Poor visualization of abdominal structures in general because of the degree of bowel gas.  Suboptimal visualization of the pancreas.   Complex right renal cyst.  In view of the fact that the mass in the tail of the pancreas cannot be visualized on ultrasound, consider followup ultrasound to reassess the pancreas and also the complex right renal cyst.

## 2013-01-15 ENCOUNTER — Emergency Department: Payer: Self-pay | Admitting: Emergency Medicine

## 2015-11-03 ENCOUNTER — Inpatient Hospital Stay
Admission: EM | Admit: 2015-11-03 | Discharge: 2015-11-10 | DRG: 192 | Disposition: A | Discharge status: Skilled Nursing Facility | Payer: Medicare Other | Attending: Internal Medicine | Admitting: Internal Medicine

## 2015-11-03 ENCOUNTER — Encounter: Payer: Self-pay | Admitting: Emergency Medicine

## 2015-11-03 ENCOUNTER — Emergency Department: Payer: Medicare Other

## 2015-11-03 DIAGNOSIS — K219 Gastro-esophageal reflux disease without esophagitis: Secondary | ICD-10-CM | POA: Diagnosis present

## 2015-11-03 DIAGNOSIS — E119 Type 2 diabetes mellitus without complications: Secondary | ICD-10-CM | POA: Diagnosis present

## 2015-11-03 DIAGNOSIS — E039 Hypothyroidism, unspecified: Secondary | ICD-10-CM | POA: Diagnosis present

## 2015-11-03 DIAGNOSIS — R0902 Hypoxemia: Secondary | ICD-10-CM

## 2015-11-03 DIAGNOSIS — R000 Tachycardia, unspecified: Secondary | ICD-10-CM | POA: Diagnosis present

## 2015-11-03 DIAGNOSIS — F0390 Unspecified dementia without behavioral disturbance: Secondary | ICD-10-CM | POA: Diagnosis present

## 2015-11-03 DIAGNOSIS — J441 Chronic obstructive pulmonary disease with (acute) exacerbation: Secondary | ICD-10-CM | POA: Diagnosis not present

## 2015-11-03 DIAGNOSIS — Z23 Encounter for immunization: Secondary | ICD-10-CM

## 2015-11-03 DIAGNOSIS — N400 Enlarged prostate without lower urinary tract symptoms: Secondary | ICD-10-CM | POA: Diagnosis present

## 2015-11-03 DIAGNOSIS — D696 Thrombocytopenia, unspecified: Secondary | ICD-10-CM | POA: Diagnosis present

## 2015-11-03 DIAGNOSIS — J45909 Unspecified asthma, uncomplicated: Secondary | ICD-10-CM | POA: Diagnosis present

## 2015-11-03 DIAGNOSIS — J449 Chronic obstructive pulmonary disease, unspecified: Secondary | ICD-10-CM

## 2015-11-03 HISTORY — DX: Chronic obstructive pulmonary disease, unspecified: J44.9

## 2015-11-03 HISTORY — DX: Type 2 diabetes mellitus without complications: E11.9

## 2015-11-03 LAB — CBC WITH DIFFERENTIAL/PLATELET
BASOS PCT: 0 %
Basophils Absolute: 0 10*3/uL (ref 0–0.1)
EOS ABS: 0 10*3/uL (ref 0–0.7)
EOS PCT: 0 %
HCT: 45.7 % (ref 40.0–52.0)
HEMOGLOBIN: 15.8 g/dL (ref 13.0–18.0)
LYMPHS ABS: 0.7 10*3/uL — AB (ref 1.0–3.6)
LYMPHS PCT: 6 %
MCH: 33.3 pg (ref 26.0–34.0)
MCHC: 34.7 g/dL (ref 32.0–36.0)
MCV: 96 fL (ref 80.0–100.0)
Monocytes Absolute: 1.3 10*3/uL — ABNORMAL HIGH (ref 0.2–1.0)
Monocytes Relative: 12 %
Neutro Abs: 8.7 10*3/uL — ABNORMAL HIGH (ref 1.4–6.5)
Neutrophils Relative %: 82 %
PLATELETS: 107 10*3/uL — AB (ref 150–440)
RBC: 4.76 MIL/uL (ref 4.40–5.90)
RDW: 13.7 % (ref 11.5–14.5)
WBC: 10.6 10*3/uL (ref 3.8–10.6)

## 2015-11-03 LAB — BASIC METABOLIC PANEL
Anion gap: 7 (ref 5–15)
BUN: 29 mg/dL — AB (ref 6–20)
CHLORIDE: 102 mmol/L (ref 101–111)
CO2: 29 mmol/L (ref 22–32)
CREATININE: 0.84 mg/dL (ref 0.61–1.24)
Calcium: 8.7 mg/dL — ABNORMAL LOW (ref 8.9–10.3)
Glucose, Bld: 247 mg/dL — ABNORMAL HIGH (ref 65–99)
POTASSIUM: 4 mmol/L (ref 3.5–5.1)
SODIUM: 138 mmol/L (ref 135–145)

## 2015-11-03 LAB — TROPONIN I

## 2015-11-03 LAB — BRAIN NATRIURETIC PEPTIDE: B NATRIURETIC PEPTIDE 5: 36 pg/mL (ref 0.0–100.0)

## 2015-11-03 MED ORDER — IPRATROPIUM-ALBUTEROL 0.5-2.5 (3) MG/3ML IN SOLN
3.0000 mL | Freq: Once | RESPIRATORY_TRACT | Status: AC
  Administered 2015-11-03: 3 mL via RESPIRATORY_TRACT
  Filled 2015-11-03: qty 3

## 2015-11-03 MED ORDER — LEVOFLOXACIN IN D5W 750 MG/150ML IV SOLN
750.0000 mg | Freq: Once | INTRAVENOUS | Status: AC
  Administered 2015-11-03: 750 mg via INTRAVENOUS
  Filled 2015-11-03: qty 150

## 2015-11-03 MED ORDER — METHYLPREDNISOLONE SODIUM SUCC 125 MG IJ SOLR
125.0000 mg | Freq: Once | INTRAMUSCULAR | Status: AC
  Administered 2015-11-03: 125 mg via INTRAVENOUS
  Filled 2015-11-03: qty 2

## 2015-11-03 NOTE — ED Provider Notes (Signed)
[facility] Emergency Department Provider Note     Time seen: ----------------------------------------- 8:41 PM on 11/03/2015 -----------------------------------------    I have reviewed the triage vital signs and the nursing notes.   HISTORY  Chief Complaint No chief complaint on file.    HPI Londell Mohhomas Mclinden is a 81 y.o. male burn to the ER by EMS for shortness of breath. Patient reportedly has a history of COPD and has been having increased shortness of breath this evening. Patient denies any pain, complaining of cough that is nonproductive. She denies any other complaints at this time.    No past medical history on file.  There are no active problems to display for this patient.   No past surgical history on file.  Allergies Review of patient's allergies indicates not on file.  Social History Social History  Substance Use Topics  . Smoking status: Not on file  . Smokeless tobacco: Not on file  . Alcohol Use: Not on file    Review of Systems Constitutional: Negative for fever. Eyes: Negative for visual changes. ENT: Negative for sore throat. Cardiovascular: Negative for chest pain. Respiratory: Positive shortness of breath and cough Gastrointestinal: Negative for abdominal pain, vomiting and diarrhea. Genitourinary: Negative for dysuria. Musculoskeletal: Negative for back pain. Skin: Negative for rash. Neurological: Negative for headaches, focal weakness or numbness.  10-point ROS otherwise negative.  ____________________________________________   PHYSICAL EXAM:  VITAL SIGNS: ED Triage Vitals  Enc Vitals Group     BP --      Pulse --      Resp --      Temp --      Temp src --      SpO2 --      Weight --      Height --      Head Cir --      Peak Flow --      Pain Score --      Pain Loc --      Pain Edu? --      Excl. in GC? --     Constitutional: Alert and oriented. Well appearing and in no distress. Eyes:  Conjunctivae are normal. PERRL. Normal extraocular movements. ENT   Head: Normocephalic and atraumatic.   Nose: No congestion/rhinnorhea.   Mouth/Throat: Mucous membranes are moist.   Neck: No stridor. Cardiovascular: Normal rate, regular rhythm. Normal and symmetric distal pulses are present in all extremities. No murmurs, rubs, or gallops. Respiratory: Mild tachypnea with prolonged expiration and wheezing bilaterally. Gastrointestinal: Soft and nontender. No distention. No abdominal bruits.  Musculoskeletal: Nontender with normal range of motion in all extremities. No joint effusions.  No lower extremity tenderness nor edema. Neurologic:  Normal speech and language. No gross focal neurologic deficits are appreciated. Speech is normal. No gait instability. Skin:  Skin is warm, dry and intact. No rash noted. Psychiatric: Mood and affect are normal. Speech and behavior are normal. Patient exhibits appropriate insight and judgment. ____________________________________________  EKG: Interpreted by me. Sinus tachycardia with rate of 104 bpm, normal PR interval, wide QRS, right bundle branch block, left axis deviation  ____________________________________________  ED COURSE:  Pertinent labs & imaging results that were available during my care of the patient were reviewed by me and considered in my medical decision making (see chart for details). Patient with COPD exacerbation likely. He'll receive DuoNebs and Solu-Medrol. ____________________________________________    LABS (pertinent positives/negatives)  Labs Reviewed  CBC WITH DIFFERENTIAL/PLATELET - Abnormal; Notable for the following:  Platelets 107 (*)    Neutro Abs 8.7 (*)    Lymphs Abs 0.7 (*)    Monocytes Absolute 1.3 (*)    All other components within normal limits  BASIC METABOLIC PANEL - Abnormal; Notable for the following:    Glucose, Bld 247 (*)    BUN 29 (*)    Calcium 8.7 (*)    All other components  within normal limits  BLOOD GAS, VENOUS - Abnormal; Notable for the following:    Bicarbonate 31.4 (*)    Acid-Base Excess 4.9 (*)    All other components within normal limits  BRAIN NATRIURETIC PEPTIDE  TROPONIN I    RADIOLOGY Images were viewed by me  Chest x-ray IMPRESSION: 1. Low lung volumes. 2. Chronic elevation of the left hemidiaphragm. ____________________________________________  FINAL ASSESSMENT AND PLAN  COPD exacerbation, hypoxia  Plan: Patient with labs and imaging as dictated above. Patient's oxygen saturations have gradually drifted downward after DuoNeb since steroids here. I would recommend continued nebs, steroids and antibiotics as well as observation.   Emily Filbert, MD   Emily Filbert, MD 11/03/15 [id number]

## 2015-11-03 NOTE — ED Notes (Signed)
Pt comes into the ED via EMS from home c/o a nonproductive and wet cough that has increasingly gotten worse.  Patient is wheezing upon assessment and has h/o COPD and DM.  Patient's family has been treating with OTC mucinex with no success.  CBG 303, 98.1 axillary, 123/83, 93

## 2015-11-03 NOTE — ED Notes (Signed)
Pt place on 2L O2 via [place] per Dr. Mayford KnifeWilliams request

## 2015-11-03 NOTE — ED Notes (Signed)
Dr. Mayford KnifeWilliams aware of pt's o2sat.  Per MD, according to family, normal for pt.

## 2015-11-04 DIAGNOSIS — D696 Thrombocytopenia, unspecified: Secondary | ICD-10-CM | POA: Diagnosis present

## 2015-11-04 DIAGNOSIS — R0902 Hypoxemia: Secondary | ICD-10-CM | POA: Diagnosis present

## 2015-11-04 DIAGNOSIS — N400 Enlarged prostate without lower urinary tract symptoms: Secondary | ICD-10-CM | POA: Diagnosis present

## 2015-11-04 DIAGNOSIS — J441 Chronic obstructive pulmonary disease with (acute) exacerbation: Secondary | ICD-10-CM | POA: Diagnosis present

## 2015-11-04 DIAGNOSIS — K219 Gastro-esophageal reflux disease without esophagitis: Secondary | ICD-10-CM | POA: Diagnosis present

## 2015-11-04 DIAGNOSIS — E039 Hypothyroidism, unspecified: Secondary | ICD-10-CM | POA: Diagnosis present

## 2015-11-04 DIAGNOSIS — F0390 Unspecified dementia without behavioral disturbance: Secondary | ICD-10-CM | POA: Diagnosis present

## 2015-11-04 DIAGNOSIS — Z23 Encounter for immunization: Secondary | ICD-10-CM | POA: Diagnosis not present

## 2015-11-04 DIAGNOSIS — J45909 Unspecified asthma, uncomplicated: Secondary | ICD-10-CM | POA: Diagnosis present

## 2015-11-04 DIAGNOSIS — R000 Tachycardia, unspecified: Secondary | ICD-10-CM | POA: Diagnosis present

## 2015-11-04 DIAGNOSIS — E119 Type 2 diabetes mellitus without complications: Secondary | ICD-10-CM | POA: Diagnosis present

## 2015-11-04 LAB — BASIC METABOLIC PANEL
Anion gap: 8 (ref 5–15)
BUN: 28 mg/dL — AB (ref 6–20)
CALCIUM: 8.4 mg/dL — AB (ref 8.9–10.3)
CO2: 27 mmol/L (ref 22–32)
CREATININE: 0.79 mg/dL (ref 0.61–1.24)
Chloride: 104 mmol/L (ref 101–111)
GFR calc Af Amer: >60 mL/min (ref >60)
GFR calc non Af Amer: >60 mL/min (ref >60)
GLUCOSE: 324 mg/dL — AB (ref 65–99)
Potassium: 4.2 mmol/L (ref 3.5–5.1)
Sodium: 139 mmol/L (ref 135–145)

## 2015-11-04 LAB — GLUCOSE, CAPILLARY
GLUCOSE-CAPILLARY: 331 mg/dL — AB (ref 65–99)
Glucose-Capillary: 274 mg/dL — ABNORMAL HIGH (ref 65–99)
Glucose-Capillary: 277 mg/dL — ABNORMAL HIGH (ref 65–99)
Glucose-Capillary: 308 mg/dL — ABNORMAL HIGH (ref 65–99)
Glucose-Capillary: 321 mg/dL — ABNORMAL HIGH (ref 65–99)

## 2015-11-04 LAB — BLOOD GAS, ARTERIAL
ACID-BASE EXCESS: 2.2 mmol/L (ref 0.0–3.0)
BICARBONATE: 27.3 meq/L (ref 21.0–28.0)
FIO2: 0.28
O2 SAT: 98.6 %
PATIENT TEMPERATURE: 37
PH ART: 7.41 (ref 7.350–7.450)
pCO2 arterial: 43 mmHg (ref 32.0–48.0)
pO2, Arterial: 118 mmHg — ABNORMAL HIGH (ref 83.0–108.0)

## 2015-11-04 LAB — CBC
HEMATOCRIT: 42.9 % (ref 40.0–52.0)
Hemoglobin: 14.9 g/dL (ref 13.0–18.0)
MCH: 32.9 pg (ref 26.0–34.0)
MCHC: 34.7 g/dL (ref 32.0–36.0)
MCV: 94.8 fL (ref 80.0–100.0)
PLATELETS: 94 10*3/uL — AB (ref 150–440)
RBC: 4.53 MIL/uL (ref 4.40–5.90)
RDW: 14 % (ref 11.5–14.5)
WBC: 7.7 10*3/uL (ref 3.8–10.6)

## 2015-11-04 MED ORDER — LORATADINE 10 MG PO TABS
10.0000 mg | ORAL_TABLET | Freq: Every day | ORAL | Status: DC
  Administered 2015-11-05 – 2015-11-10 (×6): 10 mg via ORAL
  Filled 2015-11-04 (×7): qty 1

## 2015-11-04 MED ORDER — ALBUTEROL SULFATE (2.5 MG/3ML) 0.083% IN NEBU: 2.5000 mg | INHALATION_SOLUTION | RESPIRATORY_TRACT | Status: DC | PRN

## 2015-11-04 MED ORDER — INSULIN ASPART 100 UNIT/ML ~~LOC~~ SOLN
0.0000 [IU] | Freq: Every day | SUBCUTANEOUS | Status: DC
  Administered 2015-11-04: 4 [IU] via SUBCUTANEOUS
  Administered 2015-11-04: 3 [IU] via SUBCUTANEOUS
  Administered 2015-11-07 – 2015-11-09 (×3): 2 [IU] via SUBCUTANEOUS
  Filled 2015-11-04: qty 2
  Filled 2015-11-04: qty 4
  Filled 2015-11-04: qty 3
  Filled 2015-11-04 (×2): qty 2

## 2015-11-04 MED ORDER — PREDNISONE 5 MG PO TABS
10.0000 mg | ORAL_TABLET | ORAL | Status: DC
  Filled 2015-11-04: qty 2

## 2015-11-04 MED ORDER — ACETAMINOPHEN 325 MG PO TABS: 650.0000 mg | ORAL_TABLET | Freq: Four times a day (QID) | ORAL | Status: DC | PRN

## 2015-11-04 MED ORDER — METHYLPREDNISOLONE SODIUM SUCC 125 MG IJ SOLR
60.0000 mg | Freq: Every day | INTRAMUSCULAR | Status: DC
  Administered 2015-11-05 – 2015-11-06 (×2): 60 mg via INTRAVENOUS
  Filled 2015-11-04 (×2): qty 2

## 2015-11-04 MED ORDER — IPRATROPIUM-ALBUTEROL 0.5-2.5 (3) MG/3ML IN SOLN
3.0000 mL | Freq: Four times a day (QID) | RESPIRATORY_TRACT | Status: DC
  Administered 2015-11-04 – 2015-11-05 (×5): 3 mL via RESPIRATORY_TRACT
  Filled 2015-11-04 (×5): qty 3

## 2015-11-04 MED ORDER — LEVOTHYROXINE SODIUM 50 MCG PO TABS
50.0000 ug | ORAL_TABLET | Freq: Every day | ORAL | Status: DC
  Administered 2015-11-04 – 2015-11-10 (×6): 50 ug via ORAL
  Filled 2015-11-04 (×7): qty 1

## 2015-11-04 MED ORDER — INFLUENZA VAC SPLIT QUAD 0.5 ML IM SUSY
0.5000 mL | PREFILLED_SYRINGE | INTRAMUSCULAR | Status: AC
  Administered 2015-11-05: 0.5 mL via INTRAMUSCULAR
  Filled 2015-11-04: qty 0.5

## 2015-11-04 MED ORDER — ACETAMINOPHEN 650 MG RE SUPP: 650.0000 mg | Freq: Four times a day (QID) | RECTAL | Status: DC | PRN

## 2015-11-04 MED ORDER — INSULIN ASPART 100 UNIT/ML ~~LOC~~ SOLN
0.0000 [IU] | Freq: Three times a day (TID) | SUBCUTANEOUS | Status: DC
  Administered 2015-11-04: 7 [IU] via SUBCUTANEOUS
  Administered 2015-11-04: 5 [IU] via SUBCUTANEOUS
  Administered 2015-11-04: 7 [IU] via SUBCUTANEOUS
  Administered 2015-11-05: 3 [IU] via SUBCUTANEOUS
  Administered 2015-11-05: 9 [IU] via SUBCUTANEOUS
  Administered 2015-11-05: 2 [IU] via SUBCUTANEOUS
  Administered 2015-11-06 (×2): 3 [IU] via SUBCUTANEOUS
  Administered 2015-11-07: 1 [IU] via SUBCUTANEOUS
  Administered 2015-11-08: 3 [IU] via SUBCUTANEOUS
  Administered 2015-11-08 – 2015-11-09 (×3): 2 [IU] via SUBCUTANEOUS
  Administered 2015-11-09: 1 [IU] via SUBCUTANEOUS
  Administered 2015-11-10: 2 [IU] via SUBCUTANEOUS
  Filled 2015-11-04: qty 1
  Filled 2015-11-04 (×2): qty 3
  Filled 2015-11-04: qty 9
  Filled 2015-11-04 (×2): qty 3
  Filled 2015-11-04: qty 1
  Filled 2015-11-04: qty 2
  Filled 2015-11-04: qty 5
  Filled 2015-11-04: qty 2
  Filled 2015-11-04 (×2): qty 7
  Filled 2015-11-04 (×3): qty 2

## 2015-11-04 MED ORDER — SODIUM CHLORIDE 0.9 % IV SOLN
INTRAVENOUS | Status: AC
  Administered 2015-11-04: 02:00:00 via INTRAVENOUS

## 2015-11-04 MED ORDER — METHYLPREDNISOLONE SODIUM SUCC 125 MG IJ SOLR
60.0000 mg | Freq: Three times a day (TID) | INTRAMUSCULAR | Status: DC
  Administered 2015-11-04: 60 mg via INTRAVENOUS
  Filled 2015-11-04: qty 2

## 2015-11-04 MED ORDER — ENOXAPARIN SODIUM 40 MG/0.4ML ~~LOC~~ SOLN
40.0000 mg | Freq: Every day | SUBCUTANEOUS | Status: DC
  Administered 2015-11-04 – 2015-11-10 (×7): 40 mg via SUBCUTANEOUS
  Filled 2015-11-04 (×8): qty 0.4

## 2015-11-04 MED ORDER — ONDANSETRON HCL 4 MG/2ML IJ SOLN: 4.0000 mg | Freq: Four times a day (QID) | INTRAMUSCULAR | Status: DC | PRN

## 2015-11-04 MED ORDER — TAMSULOSIN HCL 0.4 MG PO CAPS
0.4000 mg | ORAL_CAPSULE | Freq: Every day | ORAL | Status: DC
  Administered 2015-11-05 – 2015-11-10 (×6): 0.4 mg via ORAL
  Filled 2015-11-04 (×7): qty 1

## 2015-11-04 MED ORDER — SODIUM CHLORIDE 0.9 % IJ SOLN
3.0000 mL | Freq: Two times a day (BID) | INTRAMUSCULAR | Status: DC
  Administered 2015-11-04 – 2015-11-10 (×10): 3 mL via INTRAVENOUS

## 2015-11-04 MED ORDER — PANTOPRAZOLE SODIUM 40 MG PO TBEC
40.0000 mg | DELAYED_RELEASE_TABLET | Freq: Every day | ORAL | Status: DC
  Administered 2015-11-05 – 2015-11-10 (×6): 40 mg via ORAL
  Filled 2015-11-04 (×7): qty 1

## 2015-11-04 MED ORDER — ONDANSETRON HCL 4 MG PO TABS: 4.0000 mg | ORAL_TABLET | Freq: Four times a day (QID) | ORAL | Status: DC | PRN

## 2015-11-04 MED ORDER — PNEUMOCOCCAL VAC POLYVALENT 25 MCG/0.5ML IJ INJ
0.5000 mL | INJECTION | INTRAMUSCULAR | Status: AC
  Administered 2015-11-05: 0.5 mL via INTRAMUSCULAR
  Filled 2015-11-04: qty 0.5

## 2015-11-04 MED ORDER — LEVOFLOXACIN IN D5W 750 MG/150ML IV SOLN
750.0000 mg | INTRAVENOUS | Status: DC
  Administered 2015-11-04: 750 mg via INTRAVENOUS
  Filled 2015-11-04 (×2): qty 150

## 2015-11-04 MED ORDER — DOCUSATE SODIUM 100 MG PO CAPS
100.0000 mg | ORAL_CAPSULE | Freq: Two times a day (BID) | ORAL | Status: DC
  Administered 2015-11-04 – 2015-11-10 (×12): 100 mg via ORAL
  Filled 2015-11-04 (×13): qty 1

## 2015-11-04 NOTE — Progress Notes (Signed)
Patient alert, awake. Tolerated a cup of applesauce, cup of ice cream, and water. No coughing noted.

## 2015-11-04 NOTE — Progress Notes (Signed)
[facility] - [place] at [facility]   PATIENT NAME: Jimmy Johnson    MR#:  [id number]  DATE OF BIRTH:  05/02/1927  SUBJECTIVE:  CHIEF COMPLAINT:   Chief Complaint  Patient presents with  . Cough   Here due to lethargy and noted to be in mild COPD exacerbation. Much more awake today and no wheezing/bronchospasm noted.  REVIEW OF SYSTEMS:    Review of Systems  Constitutional: Negative for fever and chills.  HENT: Negative for congestion and tinnitus.   Eyes: Negative for blurred vision and double vision.  Respiratory: Negative for cough, shortness of breath and wheezing.   Cardiovascular: Negative for chest pain, orthopnea and PND.  Gastrointestinal: Negative for nausea, vomiting, abdominal pain and diarrhea.  Genitourinary: Negative for dysuria and hematuria.  Neurological: Negative for dizziness, sensory change and focal weakness.  All other systems reviewed and are negative.   Nutrition: Regular Tolerating Diet: Yes Tolerating PT: Await Eval.    DRUG ALLERGIES:  No Known Allergies  VITALS:  Blood pressure 116/63, pulse 90, temperature 98.2 F (36.8 C), temperature source Oral, resp. rate 20, height  (1.778 m), weight 75.206 kg (165 lb 12.8 oz), SpO2 93 %.  PHYSICAL EXAMINATION:   Physical Exam  GENERAL:  81 y.o.-year-old patient lying in the bed lethargic but responds to questions.  EYES: Pupils equal, round, reactive to light and accommodation. No scleral icterus. Extraocular muscles intact.  HEENT: Head atraumatic, normocephalic. Oropharynx and nasopharynx clear.  NECK:  Supple, no jugular venous distention. No thyroid enlargement, no tenderness.  LUNGS: Prolonged inspiratory & expiratory phase, no wheezing, rales, rhonchi. No use of accessory muscles of respiration.  CARDIOVASCULAR: S1, S2 normal. No murmurs, rubs, or gallops.  ABDOMEN: Soft, nontender, nondistended. Bowel sounds present. No organomegaly or mass.  EXTREMITIES: No  cyanosis, clubbing or edema b/l.    NEUROLOGIC: Cranial nerves II through XII are intact. No focal Motor or sensory deficits b/l.  Globally weak PSYCHIATRIC: The patient is alert and oriented x 1.  SKIN: No obvious rash, lesion, or ulcer.    LABORATORY PANEL:   CBC  Recent Labs Lab 11/04/15 0447  WBC 7.7  HGB 14.9  HCT 42.9  PLT 94*   ------------------------------------------------------------------------------------------------------------------  Chemistries   Recent Labs Lab 11/04/15 0447  NA 139  K 4.2  CL 104  CO2 27  GLUCOSE 324*  BUN 28*  CREATININE 0.79  CALCIUM 8.4*   ------------------------------------------------------------------------------------------------------------------  Cardiac Enzymes  Recent Labs Lab 11/03/15 2043  TROPONINI <0.03   ------------------------------------------------------------------------------------------------------------------  RADIOLOGY:  Dg Chest Port 1 View  11/03/2015  CLINICAL DATA:  Nonproductive cough with progression over the past few days. EXAM: PORTABLE CHEST - 1 VIEW COMPARISON:  Two-view chest x-ray 01/15/2013. FINDINGS: The heart size is normal. Lung volumes are low. There is chronic elevation of left hemidiaphragm. Mild atelectasis or scarring is present at the left base. No other focal airspace disease is present. There is no edema or effusion to suggest failure. The visualized soft tissues and bony thorax are unremarkable. IMPRESSION: 1. Low lung volumes. 2. Chronic elevation of the left hemidiaphragm. Electronically Signed   By: Marin Roberts M.D.   On: 11/03/2015 20:54     ASSESSMENT AND PLAN:   81 year old male with past medical history of COPD, hypothyroidism, GERD, BPH who presented to the hospital due to weakness lethargy and noted to be in COPD exacerbation.  #1 COPD exacerbation-wheezing bronchospasm much improved since yesterday. -I will taper his IV steroids, continue  duo nebs ATC,  Levaquin.  - improving and will assess for Home O2 prior to discharge.   #2 AMS - etiology unclear presently. -No evidence of acute infectious etiology presently. -Much improved today, questionable if patient has some mild underlying dementia.  #3 hypothyroidism-continue Synthroid  - I will check a TSH  #4 BPH-continue Flomax.  #5 thrombocytopenia-etiology unclear. Will monitor. -No evidence of acute bleeding presently.  #6 DM - SSI and follow sugars  Will get PT eval.  I attempted to call family members multiple times but on is a wrong number and the other number no one answered and I will reattempt tomorrow  All the records are reviewed and case discussed with Care Management/Social Workerr. Management plans discussed with the patient, family and they are in agreement.  CODE STATUS: Full  DVT Prophylaxis: Ted's and SCD's  TOTAL TIME TAKING CARE OF THIS PATIENT: 30 minutes.   POSSIBLE D/C IN 1-2 DAYS, DEPENDING ON CLINICAL CONDITION.   [place] SirenSAINANI,Kimorah Ridolfi J M.D on 11/04/2015 at 1:04 PM  Between 7am to 6pm - Pager - [phone]  After 6pm go to [email or web address] - password [email or web address] [facility]  [facility]  Office  [phone]  CC: Primary care physician; No primary care provider on file.

## 2015-11-04 NOTE — H&P (Signed)
[facility] Phys[facility] - [place] at [facility]   PATIENT NAME: Drayden Lukas    MR#:  [id number]  DATE OF BIRTH:  03-22-1927  DATE OF ADMISSION:  11/03/2015  PRIMARY CARE PHYSICIAN: No primary care provider on file.   REQUESTING/REFERRING PHYSICIAN: Dr. Mayford Knife  CHIEF COMPLAINT:  Shortness of breath  HISTORY OF PRESENT ILLNESS:  Shan Valdes  is a 81 y.o. male with a known history of COPD and I was mellitus is brought into the ED by EMS for shortness of breath. Patient reported to the ED staff that he was having on productive cough and shortness of breath. Patient was very sleepy during my examination and not answering questions falling asleep. No family members are available at bedside  PAST MEDICAL HISTORY:   Past Medical History  Diagnosis Date  . COPD (chronic obstructive pulmonary disease) (HCC)   . Diabetes mellitus without complication (HCC)    hypothyroidism Allergic rhinitis  PAST SURGICAL HISTOIRY:   Past Surgical History  Procedure Laterality Date  . Foot surgery      SOCIAL HISTORY:   Social History  Substance Use Topics  . Smoking status: Former Games developer  . Smokeless tobacco: Not on file  . Alcohol Use: No    FAMILY HISTORY:  Diabetes mellitus runs in family  DRUG ALLERGIES:  No Known Allergies  REVIEW OF SYSTEMS:  Review of systems unobtainable as the patient is very sleepy and tired  MEDICATIONS AT HOME:   Prior to Admission medications   Medication Sig Start Date End Date Taking? Authorizing Provider  cetirizine (ZYRTEC) 10 MG tablet Take 10 mg by mouth daily.   Yes Historical Provider, MD  levothyroxine (SYNTHROID, LEVOTHROID) 50 MCG tablet Take 50 mcg by mouth daily before breakfast.   Yes Historical Provider, MD  omeprazole (PRILOSEC) 20 MG capsule Take 20 mg by mouth daily.   Yes Historical Provider, MD  predniSONE (DELTASONE) 10 MG tablet Take 10 mg by mouth every other day.   Yes Historical Provider, MD  tamsulosin (FLOMAX)  0.4 MG CAPS capsule Take 0.4 mg by mouth.   Yes Historical Provider, MD      VITAL SIGNS:  Blood pressure 107/69, pulse 112, temperature 98.2 F (36.8 C), temperature source Oral, resp. rate 21, height  (1.778 m), weight 75.206 kg (165 lb 12.8 oz), SpO2 93 %.  PHYSICAL EXAMINATION:  GENERAL:  81 y.o.-year-old patient lying in the bed with no acute distress.  EYES: Pupils equal, round, reactive to light and accommodation. No scleral icterus.  HEENT: Head atraumatic, normocephalic. Oropharynx and nasopharynx clear.  NECK:  Supple, no jugular venous distention. No thyroid enlargement, no tenderness.  LUNGS: Coarse bronchial breath sounds bilaterally, diffuse wheezing, no rales,rhonchi or crepitation. No use of accessory muscles of respiration.  CARDIOVASCULAR: S1, S2 normal. No murmurs, rubs, or gallops.  ABDOMEN: Soft, nontender, nondistended. Bowel sounds present. No organomegaly or mass.  EXTREMITIES: No pedal edema, cyanosis, or clubbing.  NEUROLOGIC: Patient is very sleepy and not answering any questions but opens his eyes and falling asleep.  PSYCHIATRIC: The patient is tired and sleepy SKIN: No obvious rash, lesion, or ulcer.   LABORATORY PANEL:   CBC  Recent Labs Lab 11/03/15 2043  WBC 10.6  HGB 15.8  HCT 45.7  PLT 107*   ------------------------------------------------------------------------------------------------------------------  Chemistries   Recent Labs Lab 11/03/15 2043  NA 138  K 4.0  CL 102  CO2 29  GLUCOSE 247*  BUN 29*  CREATININE 0.84  CALCIUM 8.7*   ------------------------------------------------------------------------------------------------------------------  Cardiac Enzymes  Recent Labs Lab 11/03/15 2043  TROPONINI <0.03   ------------------------------------------------------------------------------------------------------------------  RADIOLOGY:  Dg Chest Port 1 View  11/03/2015  CLINICAL DATA:  Nonproductive cough with  progression over the past few days. EXAM: PORTABLE CHEST - 1 VIEW COMPARISON:  Two-view chest x-ray 01/15/2013. FINDINGS: The heart size is normal. Lung volumes are low. There is chronic elevation of left hemidiaphragm. Mild atelectasis or scarring is present at the left base. No other focal airspace disease is present. There is no edema or effusion to suggest failure. The visualized soft tissues and bony thorax are unremarkable. IMPRESSION: 1. Low lung volumes. 2. Chronic elevation of the left hemidiaphragm. Electronically Signed   By: Marin Robertshristopher  Mattern M.D.   On: 11/03/2015 20:54    EKG:   Orders placed or performed during the hospital encounter of 11/03/15  . ED EKG  . ED EKG  . EKG 12-Lead  . EKG 12-Lead    IMPRESSION AND PLAN:  Patient is brought into the ED by EMS for shortness of breath. Patient was reporting nonproductive cough and shortness of breath to the medical staff but very sleepy during my examination  1.Acute respiratory distress secondary to COPD exacerbation Admit patient to MedSurg unit with telemetry monitoring Provide Solu-Medrol, oxygen via nasal cannula, and laser treatments and levofloxacin empirically  2. Sinus tachycardia secondary to respiratory distress and nebulizer treatments Monitor patient closely on telemetry  3. Diabetes mellitus Patient is currently lethargic. Keep him nothing by mouth and provide sliding scale insulin   4. Hypothyroidism   resume Synthyroid his home medication and patient is more awake and alert  5. Allergic rhinitis continue Zyrtec  Provide GI and DVT prophylaxis  All the records are reviewed and case discussed with ED provider. Management plans discussed with the RN, patient is very sleepy and no family members are available at this time .need to be discussed with the patient and family in the a.m.   CODE STATUS: Full code until CODE STATUS is determined  TOTAL TIME TAKING CARE OF THIS PATIENT: 45 minutes.    Ramonita LabGouru,  Analyce Tavares M.D on 11/04/2015 at 12:22 AM  Between 7am to 6pm - Pager - [phone]  After 6pm go to [email or web address] - password [email or web address] [facility]  [facility]  Office  [phone]  CC: Primary care physician; No primary care provider on file.

## 2015-11-05 DIAGNOSIS — J441 Chronic obstructive pulmonary disease with (acute) exacerbation: Secondary | ICD-10-CM | POA: Diagnosis not present

## 2015-11-05 LAB — GLUCOSE, CAPILLARY
GLUCOSE-CAPILLARY: 214 mg/dL — AB (ref 65–99)
GLUCOSE-CAPILLARY: 293 mg/dL — AB (ref 65–99)
GLUCOSE-CAPILLARY: 127 mg/dL — AB (ref 65–99)
Glucose-Capillary: 170 mg/dL — ABNORMAL HIGH (ref 65–99)
Glucose-Capillary: 381 mg/dL — ABNORMAL HIGH (ref 65–99)

## 2015-11-05 MED ORDER — BUDESONIDE 0.25 MG/2ML IN SUSP
0.2500 mg | Freq: Two times a day (BID) | RESPIRATORY_TRACT | Status: DC
  Administered 2015-11-05 – 2015-11-10 (×11): 0.25 mg via RESPIRATORY_TRACT
  Filled 2015-11-05 (×12): qty 2

## 2015-11-05 MED ORDER — HALOPERIDOL LACTATE 5 MG/ML IJ SOLN
2.5000 mg | Freq: Four times a day (QID) | INTRAMUSCULAR | Status: DC | PRN
  Administered 2015-11-05: 2.5 mg via INTRAVENOUS
  Filled 2015-11-05 (×2): qty 1

## 2015-11-05 MED ORDER — LEVOFLOXACIN 500 MG PO TABS
750.0000 mg | ORAL_TABLET | ORAL | Status: DC
  Administered 2015-11-05: 750 mg via ORAL
  Filled 2015-11-05: qty 1

## 2015-11-05 MED ORDER — IPRATROPIUM-ALBUTEROL 0.5-2.5 (3) MG/3ML IN SOLN
3.0000 mL | RESPIRATORY_TRACT | Status: DC
  Administered 2015-11-05 – 2015-11-06 (×4): 3 mL via RESPIRATORY_TRACT
  Filled 2015-11-05 (×4): qty 3

## 2015-11-05 NOTE — Evaluation (Signed)
Physical Therapy Evaluation Patient Details Name: Jimmy Johnson MRN: [id number] DOB: December 29, 1926 Today's Date: 11/05/2015   History of Present Illness  Patient is an 81 y/o male that presents with increasing shortness of breath from home.   Clinical Impression  Patient is a poor historian and somewhat confused, thus unable to provide accurate history of living conditions and mobility needs. He demonstrates significant LE weakness during attempts at standing and ambulation, noted prominently by one episode of buckling as well as minimal step length in multiple attempts at ambulation. Pt is currently a very high falls risk given his posterior lean and minimal step length, and would benefit from short term rehabilitation after acute medical episode resolves.    Follow Up Recommendations SNF    Equipment Recommendations       Recommendations for Other Services       Precautions / Restrictions Precautions Precautions: Fall Restrictions Weight Bearing Restrictions: No      Mobility  Bed Mobility Overal bed mobility: Needs Assistance Bed Mobility: Supine to Sit     Supine to sit: Min assist     General bed mobility comments: Patient is able to begin bed mobility, however he requires assistance to bring LEs off the edge of the bed.   Transfers Overall transfer level: Needs assistance Equipment used: Rolling walker (2 wheeled) Transfers: Sit to/from Stand Sit to Stand: Mod assist         General transfer comment: Patient leans posteriorly throughout attempts at standing, very poor standing balance requiring PT to provide anterior force to counteract posterior lean.   Ambulation/Gait Ambulation/Gait assistance: Mod assist Ambulation Distance (Feet): 2 Feet Assistive device: Rolling walker (2 wheeled) Gait Pattern/deviations: Decreased step length - right;Decreased step length - left   Gait velocity interpretation: Below normal speed for age/gender General Gait Details:  Patient leans posteriorly throughout attempt at ambulation, no buckling but very minimal step lengths noted. Patient unable to provide increased step lengths with cuing.   Stairs            Wheelchair Mobility    Modified Rankin (Stroke Patients Only)       Balance Overall balance assessment: Needs assistance Sitting-balance support: Bilateral upper extremity supported Sitting balance-Leahy Scale: Fair Sitting balance - Comments: Patient leans posteriorly initially, able to correct with prolonged sitting.  Postural control: Posterior lean Standing balance support: Bilateral upper extremity supported Standing balance-Leahy Scale: Poor Standing balance comment: Patient continues to lean posteriorly during attempt at ambulation, very poor standing balance.                              Pertinent Vitals/Pain Pain Assessment: No/denies pain    Home Living Family/patient expects to be discharged to:: Skilled nursing facility                 Additional Comments: Patient is a poor historian, but states he lives with his sons and has 5 steps to enter.     Prior Function Level of Independence: Independent with assistive device(s)         Comments: It appears patient was ambulating with RW, though given his gait mechanics and posture he was likely assisted and or very limited recently. Patient states he hasn't done much in 2 weeks. Unclear if this is accurate.      Hand Dominance        Extremity/Trunk Assessment   Upper Extremity Assessment: Difficult to assess due to impaired cognition  Lower Extremity Assessment: Difficult to assess due to impaired cognition         Communication   Communication: No difficulties  Cognition Arousal/Alertness: Awake/alert Behavior During Therapy: WFL for tasks assessed/performed Overall Cognitive Status: History of cognitive impairments - at baseline (Patient appears confused, though this is baseline  likely. Reports his wife died this morning.)                      General Comments      Exercises        Assessment/Plan    PT Assessment Patient needs continued PT services  PT Diagnosis Difficulty walking;Generalized weakness   PT Problem List Decreased strength;Decreased knowledge of use of DME;Decreased safety awareness;Decreased activity tolerance;Cardiopulmonary status limiting activity;Decreased balance;Decreased mobility;Decreased cognition  PT Treatment Interventions DME instruction;Gait training;Stair training;Therapeutic exercise;Therapeutic activities;Balance training   PT Goals (Current goals can be found in the Care Plan section) Acute Rehab PT Goals PT Goal Formulation: Patient unable to participate in goal setting Time For Goal Achievement: 11/19/15 Potential to Achieve Goals: Fair    Frequency Min 2X/week   Barriers to discharge Decreased caregiver support      Co-evaluation               End of Session Equipment Utilized During Treatment: Gait belt Activity Tolerance: Patient limited by fatigue Patient left: in chair;with call bell/phone within reach;with chair alarm set Nurse Communication: Mobility status         Time: [id number] PT Time Calculation (min) (ACUTE ONLY): 18 min   Charges:   PT Evaluation $Initial PT Evaluation Tier I: 1 Procedure     PT G Codes:       Kerin RansomPatrick A Irish Breisch, PT, DPT    11/05/2015, 3:00 PM

## 2015-11-05 NOTE — Progress Notes (Signed)
[facility] [place] at [facility]   PATIENT NAME: Jimmy Johnson    MR#:  [id number]  DATE OF BIRTH:  03/30/1927  SUBJECTIVE:  CHIEF COMPLAINT:   Chief Complaint  Patient presents with  . Cough   Here due to lethargy and noted to be in mild COPD exacerbation. More awake today but confused.  Has some wheezing/bronchospam today.   REVIEW OF SYSTEMS:    Review of Systems  Constitutional: Negative for fever and chills.  HENT: Negative for congestion and tinnitus.   Eyes: Negative for blurred vision and double vision.  Respiratory: Positive for cough, shortness of breath and wheezing.   Cardiovascular: Negative for chest pain, orthopnea and PND.  Gastrointestinal: Negative for nausea, vomiting, abdominal pain and diarrhea.  Genitourinary: Negative for dysuria and hematuria.  Neurological: Negative for dizziness, sensory change and focal weakness.  All other systems reviewed and are negative.   Nutrition: Regular Tolerating Diet: Yes Tolerating PT: Await Eval.    DRUG ALLERGIES:  No Known Allergies  VITALS:  Blood pressure 116/69, pulse 84, temperature 98.2 F (36.8 C), temperature source Oral, resp. rate 20, height 5\' 10"  (1.778 m), weight 75.206 kg (165 lb 12.8 oz), SpO2 94 %.  PHYSICAL EXAMINATION:   Physical Exam  GENERAL:  81 y.o.-year-old patient lying in bed in NAD.   EYES: Pupils equal, round, reactive to light and accommodation. No scleral icterus. Extraocular muscles intact.  HEENT: Head atraumatic, normocephalic. Oropharynx and nasopharynx clear.  NECK:  Supple, no jugular venous distention. No thyroid enlargement, no tenderness.  LUNGS: Prolonged inspiratory & expiratory phase, diffuse wheezing, rhonchi b/l. No use of accessory muscles of respiration.  CARDIOVASCULAR: S1, S2 normal. No murmurs, rubs, or gallops.  ABDOMEN: Soft, nontender, nondistended. Bowel sounds present. No organomegaly or mass.  EXTREMITIES: No cyanosis, clubbing  or edema b/l.    NEUROLOGIC: Cranial nerves II through XII are intact. No focal Motor or sensory deficits b/l.  Globally weak PSYCHIATRIC: The patient is alert and oriented x 1.  SKIN: No obvious rash, lesion, or ulcer.    LABORATORY PANEL:   CBC  Recent Labs Lab 11/04/15 0447  WBC 7.7  HGB 14.9  HCT 42.9  PLT 94*   ------------------------------------------------------------------------------------------------------------------  Chemistries   Recent Labs Lab 11/04/15 0447  NA 139  K 4.2  CL 104  CO2 27  GLUCOSE 324*  BUN 28*  CREATININE 0.79  CALCIUM 8.4*   ------------------------------------------------------------------------------------------------------------------  Cardiac Enzymes  Recent Labs Lab 11/03/15 2043  TROPONINI <0.03   ------------------------------------------------------------------------------------------------------------------  RADIOLOGY:  Dg Chest Port 1 View  11/03/2015  CLINICAL DATA:  Nonproductive cough with progression over the past few days. EXAM: PORTABLE CHEST - 1 VIEW COMPARISON:  Two-view chest x-ray 01/15/2013. FINDINGS: The heart size is normal. Lung volumes are low. There is chronic elevation of left hemidiaphragm. Mild atelectasis or scarring is present at the left base. No other focal airspace disease is present. There is no edema or effusion to suggest failure. The visualized soft tissues and bony thorax are unremarkable. IMPRESSION: 1. Low lung volumes. 2. Chronic elevation of the left hemidiaphragm. Electronically Signed   By: Marin Robertshristopher  Mattern M.D.   On: 11/03/2015 20:54     ASSESSMENT AND PLAN:   81 year old male with past medical history of COPD, hypothyroidism, GERD, BPH who presented to the hospital due to weakness lethargy and noted to be in COPD exacerbation.  #1 COPD exacerbation-patient has some more wheezing and bronchospasm today. - Continue IV steroids, continue  duo nebs ATC, will add Pulmicort nebs,  cont. Levaquin.  - Patient already on oxygen at home.  #2 AMS - likely due to underlying dementia. Discussed with patient's daughter-in-law over the phone and patient has underlying dementia and may have been worse now with the acute respiratory issues. -No evidence of acute infectious etiology presently.  #3 hypothyroidism-continue Synthroid   #4 BPH-continue Flomax.  #5 thrombocytopenia-etiology unclear. Will monitor. -No evidence of acute bleeding presently.  #6 DM - diet controlled at home. Blood sugars are labile here due to the IV steroids. -Continue sliding scale insulin for now.  Await physical therapy evaluation. Discussed plan of care with the patient's daughter-in-law over the phone.  All the records are reviewed and case discussed with Care Management/Social Workerr. Management plans discussed with the patient, family and they are in agreement.  CODE STATUS: Full  DVT Prophylaxis: Ted's and SCD's  TOTAL TIME TAKING CARE OF THIS PATIENT: 30 minutes.   POSSIBLE D/C IN 1-2 DAYS, DEPENDING ON CLINICAL CONDITION.   Houston Siren M.D on 11/05/2015 at 2:23 PM  Between 7am to 6pm - Pager - [phone]  After 6pm go to [email or web address] - password [email or web address] [facility]  [facility] [facility]  Office  [phone]  CC: Primary care physician; No primary care provider on file.

## 2015-11-05 NOTE — Progress Notes (Signed)
PHARMACIST - PHYSICIAN COMMUNICATION DR:   sainani CONCERNING: Antibiotic IV to Oral Route Change Policy  RECOMMENDATION: This patient is receiving levofloxacin by the intravenous route.  Based on criteria approved by the Pharmacy and Therapeutics Committee, the antibiotic(s) is/are being converted to the equivalent oral dose form(s).   DESCRIPTION: These criteria include:  Patient being treated for a respiratory tract infection, urinary tract infection, cellulitis or clostridium difficile associated diarrhea if on metronidazole  The patient is not neutropenic and does not exhibit a GI malabsorption state  The patient is eating (either orally or via tube) and/or has been taking other orally administered medications for a least 24 hours  The patient is improving clinically and has a Tmax < 100.5  If you have questions about this conversion, please contact the Pharmacy Department  []   [phone]( [phone] )  [facility] [x]   [phone]( [phone] )  [facility] []   [phone]( [phone] )  [facility] []   [phone]( [phone] )  [facility] []   [phone]( [phone] )  [facility]

## 2015-11-05 NOTE — Clinical Social Work Note (Signed)
Clinical Social Work Assessment  Patient Details  Name: Jimmy Johnson MRN: [id number] Date of Birth: 09/07/1927  Date of referral:  11/05/15               Reason for consult:  Facility Placement                Permission sought to share information with:  Family Supports Permission granted to share information::  Yes, Verbal Permission Granted  Name::     Jimmy Johnson, daughter   Housing/Transportation Living arrangements for the past 2 months:  Single Family Home Source of Information:  Adult Children Patient Interpreter Needed:  None Criminal Activity/Legal Involvement Pertinent to Current Situation/Hospitalization:  No - Comment as needed Significant Relationships:  Adult Children Lives with:  Adult Children Do you feel safe going back to the place where you live?  Yes Need for family participation in patient care:  Yes (Comment)  Care giving concerns:  Pt may need LTC.    Social Worker assessment / plan:  CSW spoke with pt's daughter to address consult for SNF. CSW introduced herself and explained role of social work. CSW also explained process of discharging to SNF. PT is recommending SNF for STR at discharge.   Pt lives with son and daughter in law. Pt does not have a power of attorney, however is confused today per MD note. CSW explained the process of LTC at the end of STR. CSW left a list of resources (SNF, ALF, and DSS) at bedside. CSW also recommended that pt's family explore Medicaid options if private paying is not an option.   CSW will initiate SNF search and follow up with bed offers. CSW will continue to follow.   Employment status:  Retired Health and safety inspectornsurance information:  [employer] PT Recommendations:  Skilled Teacher, early years/preursing Facility Information / Referral to community resources:  Skilled Holiday representativeursing Facility, Other (Comment Required) (ALFs and [place] DSS)  Patient/Family's Response to care:  Pt's daughter in law was appreciative of CSW support.   Patient/Family's  Understanding of and Emotional Response to Diagnosis, Current Treatment, and Prognosis:  Pt's daughter in law will discuss LTC options with her husband and brother in law (pt's sons) as she understands that pt may need a higher level of care after completing STR.   Emotional Assessment Appearance:  Appears older than stated age Attitude/Demeanor/Rapport:  Guarded Affect (typically observed):  Appropriate Orientation:  Oriented to Self Alcohol / Substance use:  Never Used Psych involvement (Current and /or in the community):  No (Comment)  Discharge Needs  Concerns to be addressed:  Adjustment to Illness Readmission within the last 30 days:  No Current discharge risk:  Chronically ill Barriers to Discharge:  No Barriers Identified   Dede QuerySarah Kevan Prouty, LCSW 11/05/2015, 4:47 PM

## 2015-11-05 NOTE — Care Management Note (Signed)
Case Management Note  Patient Details  Name: Jimmy Johnson MRN: [id number] Date of Birth: 08/06/1927  Subjective/Objective:   Admitted with acute COPD exacerbation. PT evaluation pending. Attempted to reach family members on file without success.  Will follow                Action/Plan:   Expected Discharge Date:  11/07/15               Expected Discharge Plan:     In-House Referral:     Discharge planning Services  CM Consult  Post Acute Care Choice:    Choice offered to:     DME Arranged:    DME Agency:     HH Arranged:    HH Agency:     Status of Service:  In process, will continue to follow  Medicare Important Message Given:    Date Medicare IM Given:    Medicare IM give by:    Date Additional Medicare IM Given:    Additional Medicare Important Message give by:     If discussed at Long Length of Stay Meetings, dates discussed:    Additional Comments:  Marily MemosLisa M Elizabeht Suto, RN 11/05/2015, 1:08 PM

## 2015-11-05 NOTE — Progress Notes (Signed)
Pt is very confused today.  Constantly asking to go home or saying his wife died and he is going to the funeral today.  Haldol ordered this pm is needed

## 2015-11-06 LAB — CBC
HCT: 40.2 % (ref 40.0–52.0)
Hemoglobin: 14.1 g/dL (ref 13.0–18.0)
MCH: 32.8 pg (ref 26.0–34.0)
MCHC: 35 g/dL (ref 32.0–36.0)
MCV: 93.7 fL (ref 80.0–100.0)
PLATELETS: 106 10*3/uL — AB (ref 150–440)
RBC: 4.29 MIL/uL — AB (ref 4.40–5.90)
RDW: 13.8 % (ref 11.5–14.5)
WBC: 8.4 10*3/uL (ref 3.8–10.6)

## 2015-11-06 LAB — BLOOD GAS, VENOUS
Acid-Base Excess: 4.9 mmol/L — ABNORMAL HIGH (ref 0.0–3.0)
BICARBONATE: 31.4 meq/L — AB (ref 21.0–28.0)
Patient temperature: 37
pCO2, Ven: 53 mmHg (ref 44.0–60.0)
pH, Ven: 7.38 (ref 7.320–7.430)

## 2015-11-06 LAB — GLUCOSE, CAPILLARY
GLUCOSE-CAPILLARY: 115 mg/dL — AB (ref 65–99)
GLUCOSE-CAPILLARY: 220 mg/dL — AB (ref 65–99)
Glucose-Capillary: 246 mg/dL — ABNORMAL HIGH (ref 65–99)
Glucose-Capillary: 199 mg/dL — ABNORMAL HIGH (ref 65–99)

## 2015-11-06 MED ORDER — METHYLPREDNISOLONE SODIUM SUCC 40 MG IJ SOLR
40.0000 mg | Freq: Every day | INTRAMUSCULAR | Status: DC
  Administered 2015-11-07 – 2015-11-10 (×4): 40 mg via INTRAVENOUS
  Filled 2015-11-06 (×4): qty 1

## 2015-11-06 MED ORDER — IPRATROPIUM-ALBUTEROL 0.5-2.5 (3) MG/3ML IN SOLN
3.0000 mL | Freq: Two times a day (BID) | RESPIRATORY_TRACT | Status: DC
  Administered 2015-11-06 – 2015-11-10 (×8): 3 mL via RESPIRATORY_TRACT
  Filled 2015-11-06 (×8): qty 3

## 2015-11-06 MED ORDER — LEVOFLOXACIN 500 MG PO TABS
750.0000 mg | ORAL_TABLET | ORAL | Status: DC
  Administered 2015-11-06 – 2015-11-08 (×3): 750 mg via ORAL
  Filled 2015-11-06 (×3): qty 1

## 2015-11-06 MED ORDER — INSULIN ASPART 100 UNIT/ML ~~LOC~~ SOLN
3.0000 [IU] | Freq: Three times a day (TID) | SUBCUTANEOUS | Status: DC
  Administered 2015-11-06 – 2015-11-09 (×9): 3 [IU] via SUBCUTANEOUS
  Filled 2015-11-06 (×9): qty 3

## 2015-11-06 NOTE — Progress Notes (Signed)
Inpatient Diabetes Program Recommendations  AACE/ADA: New Consensus Statement on Inpatient Glycemic Control (2015)  Target Ranges:  Prepandial:   less than 140 mg/dL      Peak postprandial:   less than 180 mg/dL (1-2 hours)      Critically ill patients:  140 - 180 mg/dL  Results for Londell MohBORN, Yanky (MRN [id number]) as of 11/06/2015 11:22  Ref. Range 11/05/2015 07:34 11/05/2015 11:37 11/05/2015 16:21 11/05/2015 18:15 11/05/2015 20:36 11/06/2015 07:49  Glucose-Capillary Latest Ref Range: 65-99 mg/dL 409170 (H) 811214 (H) 914293 (H) 381 (H) 127 (H) 115 (H)   Review of Glycemic Control  Diabetes history: DM2 Outpatient Diabetes medications: None; diet controlled Current orders for Inpatient glycemic control: Novolog 0-9 units TID with meals, Novolog 0-5 units HS  Inpatient Diabetes Program Recommendations: Insulin - Meal Coverage: If steroids are continued as ordered, please consider ordering Novolog 4 units TID with meals for meal coverage (in addition to Novolog correction scale).  Thanks, Orlando PennerMarie Yahayra Geis, RN, MSN, CDE Diabetes Coordinator Inpatient Diabetes Program [phone] (Team Pager from 8am to 5pm) [phone] (AP office) [phone] [facility] office) [phone] [facility] office)

## 2015-11-06 NOTE — Progress Notes (Signed)
[facility] - [place] at [facility]   PATIENT NAME: Jimmy Johnson    MR#:  [id number]  DATE OF BIRTH:  1927-10-15  SUBJECTIVE:  Patient with some mild confusion this morning and reports some shortness of breath. Overall reports doing better.   REVIEW OF SYSTEMS:    Review of Systems  Constitutional: Negative for fever and chills.  HENT: Negative for congestion and tinnitus.   Eyes: Negative for blurred vision and double vision.  Respiratory: Positive for cough and shortness of breath. Negative for hemoptysis, sputum production and wheezing.   Cardiovascular: Negative for chest pain, orthopnea and PND.  Gastrointestinal: Negative for nausea, vomiting, abdominal pain and diarrhea.  Genitourinary: Negative for dysuria and hematuria.  Neurological: Negative for dizziness, sensory change and focal weakness.  All other systems reviewed and are negative.   Nutrition: Regular Tolerating Diet: Yes   DRUG ALLERGIES:  No Known Allergies  VITALS:  Blood pressure 127/57, pulse 78, temperature 97.9 F (36.6 C), temperature source Oral, resp. rate 18, height  (1.778 m), weight 75.206 kg (165 lb 12.8 oz), SpO2 94 %.  PHYSICAL EXAMINATION:   Physical Exam  Constitutional: He is well-developed, well-nourished, and in no distress. No distress.  HENT:  Head: Normocephalic.  Eyes: No scleral icterus.  Neck: Normal range of motion. Neck supple. No JVD present. No tracheal deviation present.  Cardiovascular: Normal rate, regular rhythm and normal heart sounds.  Exam reveals no gallop and no friction rub.   No murmur heard. Pulmonary/Chest: Effort normal. No respiratory distress. He has wheezes. He has no rales. He exhibits no tenderness.  Abdominal: Soft. Bowel sounds are normal. He exhibits no distension and no mass. There is no tenderness. There is no rebound and no guarding.  Musculoskeletal: Normal range of motion. He exhibits no edema.  Neurological: He is  alert.  Skin: Skin is warm. No rash noted. No erythema.       LABORATORY PANEL:   CBC  Recent Labs Lab 11/06/15 0851  WBC 8.4  HGB 14.1  HCT 40.2  PLT 106*   ------------------------------------------------------------------------------------------------------------------  Chemistries   Recent Labs Lab 11/04/15 0447  NA 139  K 4.2  CL 104  CO2 27  GLUCOSE 324*  BUN 28*  CREATININE 0.79  CALCIUM 8.4*   ------------------------------------------------------------------------------------------------------------------  Cardiac Enzymes  Recent Labs Lab 11/03/15 2043  TROPONINI <0.03   ------------------------------------------------------------------------------------------------------------------  RADIOLOGY:  No results found.   ASSESSMENT AND PLAN:   81 year old male with past medical history of COPD, hypothyroidism, GERD, BPH who presented to the hospital due to weakness lethargy and noted to be in COPD exacerbation.  1 COPD exacerbation: Patient has somewhat improved since admission. I will wean steroids to 40 mg IV daily. Continue inhalers and nebulizer treatments twice a day. Patient does have oxygen at home. Patient will need skilled facility at discharge.    2 AMS:  patient has underlying dementia and his confusion has worsened due to being in the hospital and COPD exacerbation. Continue to reorient the patient. Continue to have the patient awakened the daytime and sleeps at night.   3 hypothyroidism-continue Synthroid   4 BPH-continue Flomax.  5 thrombocytopenia-etiology unclear. platelet count 106 and has improved. Patient is on Lovenox daily for DVT prophylaxis and will need to continue to monitor daily.  -No evidence of acute bleeding presently.  6 DM - diet controlled at home. Blood sugars are labile here due to the IV steroids. Continue sliding scale insulin for now  and added 3 units NovoLog 3 times a day as per recommendations by diabetes  educator.    . Management plans discussed with the patient. CODE STATUS: Full  DVT Prophylaxis: Lovenox  TOTAL TIME TAKING CARE OF THIS PATIENT: 25 minutes.   POSSIBLE D/C IN 1-2 DAYS to skilled nursing facility, DEPENDING ON CLINICAL CONDITION.   Shonnie Poudrier M.D on 11/06/2015 at 11:50 AM  Between 7am to 6pm - Pager - [phone]  After 6pm go to [email or web address] - password [email or web address] [facility]  [facility]  Office  [phone]  CC: Primary care physician; No primary care provider on file.

## 2015-11-06 NOTE — Care Management (Signed)
Lab was drawing blood when I rounded on this patient. No family at bedside. I understand that patient may not be able to provide meaningful information due to dementia. PT is recommending SNF. CSW will follow. Patient requires O2 currently.

## 2015-11-06 NOTE — Care Management Important Message (Signed)
Important Message  Patient Details  Name: Jimmy Johnson MRN: [id number] Date of Birth: 05/01/1927   Medicare Important Message Given:  Yes    Collie Siadngela Xaiver Roskelley, RN 11/06/2015, 7:52 AM

## 2015-11-06 NOTE — [place] FL2 (Signed)
[place] MEDICAID FL2 LEVEL OF CARE SCREENING TOOL     IDENTIFICATION  Patient Name: Jimmy Johnson Birthdate: 31-Aug-1927 Sex: male Admission Date (Current Location): 11/03/2015  [place] and [place] Number:  Chiropodist and Address:  [facility], [address] [place], [place] [address]      Provider Number: [id number]  Attending Physician Name and Address:  Adrian Saran, MD  Relative Name and Phone Number:       Current Level of Care: Hospital Recommended Level of Care: Skilled Nursing Facility Prior Approval Number:    Date Approved/Denied:   PASRR Number: [id number]  Discharge Plan: Home    Current Diagnoses: Patient Active Problem List   Diagnosis Date Noted  . COPD with acute exacerbation (HCC) 11/04/2015    Orientation RESPIRATION BLADDER Height & Weight    Self, Place  O2 (1L) Incontinent  (177.8 cm) 165 lbs.  BEHAVIORAL SYMPTOMS/MOOD NEUROLOGICAL BOWEL NUTRITION STATUS      Incontinent Diet (Carb Modified, Thin Liquids)  AMBULATORY STATUS COMMUNICATION OF NEEDS Skin   Extensive Assist Verbally Normal                       Personal Care Assistance Level of Assistance  Bathing, Feeding, Dressing Bathing Assistance: Maximum assistance Feeding assistance: Maximum assistance Dressing Assistance: Maximum assistance     Functional Limitations Info  Sight, Hearing, Speech Sight Info: Adequate Hearing Info: Impaired Speech Info: Adequate    SPECIAL CARE FACTORS FREQUENCY  PT (By licensed PT)     PT Frequency: 5              Contractures      Additional Factors Info  Code Status, Allergies Code Status Info: Full Code Allergies Info: No known allergies           Current Medications (11/06/2015):  This is the current hospital active medication list Current Facility-Administered Medications  Medication Dose Route Frequency Provider Last Rate Last Dose  . acetaminophen (TYLENOL) tablet 650 mg   650 mg Oral Q6H PRN Ramonita Lab, MD       Or  . acetaminophen (TYLENOL) suppository 650 mg  650 mg Rectal Q6H PRN Aruna Gouru, MD      . albuterol (PROVENTIL) (2.5 MG/3ML) 0.083% nebulizer solution 2.5 mg  2.5 mg Nebulization Q4H PRN Deanna Artis Gouru, MD      . budesonide (PULMICORT) nebulizer solution 0.25 mg  0.25 mg Nebulization BID [place] Siren, MD   0.25 mg at 11/06/15 0738  . docusate sodium (COLACE) capsule 100 mg  100 mg Oral BID Ramonita Lab, MD   100 mg at 11/06/15 0914  . enoxaparin (LOVENOX) injection 40 mg  40 mg Subcutaneous Daily Ramonita Lab, MD   40 mg at 11/06/15 0914  . haloperidol lactate (HALDOL) injection 2.5 mg  2.5 mg Intravenous Q6H PRN [place] Siren, MD   2.5 mg at 11/05/15 2109  . insulin aspart (novoLOG) injection 0-5 Units  0-5 Units Subcutaneous QHS Ramonita Lab, MD   3 Units at 11/04/15 2224  . insulin aspart (novoLOG) injection 0-9 Units  0-9 Units Subcutaneous TID WC Ramonita Lab, MD   3 Units at 11/06/15 1211  . insulin aspart (novoLOG) injection 3 Units  3 Units Subcutaneous TID WC Adrian Saran, MD   3 Units at 11/06/15 1210  . ipratropium-albuterol (DUONEB) 0.5-2.5 (3) MG/3ML nebulizer solution 3 mL  3 mL Nebulization BID Adrian Saran, MD      .  levofloxacin (LEVAQUIN) tablet 750 mg  750 mg Oral Q24H Sital Mody, MD      . levothyroxine (SYNTHROID, LEVOTHROID) tablet 50 mcg  50 mcg Oral QAC breakfast Ramonita LabAruna Gouru, MD   50 mcg at 11/06/15 0521  . loratadine (CLARITIN) tablet 10 mg  10 mg Oral Daily Ramonita LabAruna Gouru, MD   10 mg at 11/06/15 0915  . [START ON 11/07/2015] methylPREDNISolone sodium succinate (SOLU-MEDROL) 40 mg/mL injection 40 mg  40 mg Intravenous Daily Sital Mody, MD      . ondansetron (ZOFRAN) tablet 4 mg  4 mg Oral Q6H PRN Ramonita LabAruna Gouru, MD       Or  . ondansetron (ZOFRAN) injection 4 mg  4 mg Intravenous Q6H PRN Aruna Gouru, MD      . pantoprazole (PROTONIX) EC tablet 40 mg  40 mg Oral QAC breakfast Ramonita LabAruna Gouru, MD   40 mg at 11/06/15 0914  . sodium chloride 0.9  % injection 3 mL  3 mL Intravenous Q12H Ramonita LabAruna Gouru, MD   3 mL at 11/06/15 0915  . tamsulosin (FLOMAX) capsule 0.4 mg  0.4 mg Oral Daily Ramonita LabAruna Gouru, MD   0.4 mg at 11/06/15 [id number]     Discharge Medications: Please see discharge summary for a list of discharge medications.  Relevant Imaging Results:  Relevant Lab Results:   Additional Information SSN:  [id number]  Dede QuerySarah Shanyce Daris, LCSW

## 2015-11-07 LAB — CBC
HEMATOCRIT: 39 % — AB (ref 40.0–52.0)
Hemoglobin: 13.7 g/dL (ref 13.0–18.0)
MCH: 33.5 pg (ref 26.0–34.0)
MCHC: 35.1 g/dL (ref 32.0–36.0)
MCV: 95.5 fL (ref 80.0–100.0)
Platelets: 109 10*3/uL — ABNORMAL LOW (ref 150–440)
RBC: 4.08 MIL/uL — ABNORMAL LOW (ref 4.40–5.90)
RDW: 13.3 % (ref 11.5–14.5)
WBC: 10 10*3/uL (ref 3.8–10.6)

## 2015-11-07 LAB — GLUCOSE, CAPILLARY
GLUCOSE-CAPILLARY: 218 mg/dL — AB (ref 65–99)
Glucose-Capillary: 124 mg/dL — ABNORMAL HIGH (ref 65–99)
Glucose-Capillary: 117 mg/dL — ABNORMAL HIGH (ref 65–99)
Glucose-Capillary: 118 mg/dL — ABNORMAL HIGH (ref 65–99)

## 2015-11-07 NOTE — Clinical Social Work Note (Signed)
Clinical Social Worker spoke with pt's daughter in law, Harriett Sineancy and presented bed offers. Pt's daughter in law chose [facility]. CSW updated facility. Pt will likely be ready for discharge on 11/08/2015. CSW will continue to follow.   Dede QuerySarah Delissa Silba, MSW, LCSW Clinical social Worker [phone]

## 2015-11-07 NOTE — Progress Notes (Signed)
[facility] Physicians -  at [facility]   PATIENT NAME: Jimmy Johnson    MR#:  [id number]  DATE OF BIRTH:  02-10-1927  SUBJECTIVE:  Patient reports that he is doing better this morning. Does not appear to be S confuses just red.  REVIEW OF SYSTEMS:    Review of Systems  Constitutional: Negative for fever and chills.  HENT: Negative for congestion and tinnitus.   Eyes: Negative for blurred vision and double vision.  Respiratory: Positive for cough. Negative for hemoptysis, sputum production, shortness of breath and wheezing.   Cardiovascular: Negative for chest pain, orthopnea, leg swelling and PND.  Gastrointestinal: Negative for nausea, vomiting, abdominal pain and diarrhea.  Genitourinary: Negative for dysuria and hematuria.  Neurological: Negative for dizziness, sensory change and focal weakness.  All other systems reviewed and are negative.   Nutrition: Regular Tolerating Diet: Yes   DRUG ALLERGIES:  No Known Allergies  VITALS:  Blood pressure 138/75, pulse 96, temperature 98.5 F (36.9 C), temperature source Oral, resp. rate 18, height  (1.778 m), weight 75.206 kg (165 lb 12.8 oz), SpO2 94 %.  PHYSICAL EXAMINATION:   Physical Exam  Constitutional: He is well-developed, well-nourished, and in no distress. No distress.  HENT:  Head: Normocephalic.  Eyes: No scleral icterus.  Neck: Normal range of motion. Neck supple. No JVD present. No tracheal deviation present.  Cardiovascular: Normal rate, regular rhythm and normal heart sounds.  Exam reveals no gallop and no friction rub.   No murmur heard. Pulmonary/Chest: Effort normal. No respiratory distress. He has wheezes. He has no rales. He exhibits no tenderness.  Abdominal: Soft. Bowel sounds are normal. He exhibits no distension and no mass. There is no tenderness. There is no rebound and no guarding.  Musculoskeletal: Normal range of motion. He exhibits no edema.  Neurological: He is alert.   Skin: Skin is warm. No rash noted. No erythema.       LABORATORY PANEL:   CBC  Recent Labs Lab 11/07/15 0545  WBC 10.0  HGB 13.7  HCT 39.0*  PLT 109*   ------------------------------------------------------------------------------------------------------------------  Chemistries   Recent Labs Lab 11/04/15 0447  NA 139  K 4.2  CL 104  CO2 27  GLUCOSE 324*  BUN 28*  CREATININE 0.79  CALCIUM 8.4*   ------------------------------------------------------------------------------------------------------------------  Cardiac Enzymes  Recent Labs Lab 11/03/15 2043  TROPONINI <0.03   ------------------------------------------------------------------------------------------------------------------  RADIOLOGY:  No results found.   ASSESSMENT AND PLAN:   81 year old male with past medical history of COPD, hypothyroidism, GERD, BPH who presented to the hospital due to weakness lethargy and noted to be in COPD exacerbation.  1 COPD exacerbation: Patient has improved since admission. He is still having expiratory wheezing although improved since yesterday. Continue 40 mg IV Solu-Medrol. Continue inhalers and nebulizer treatments .Marland Kitchen Patient does have oxygen at home. Patient will need skilled facility at discharge.    2 AMS:  patient has underlying dementia and his confusion has worsened due to being in the hospital and COPD exacerbation. Continue to reorient the patient. Continue to have the patient awakened the daytime and sleeps at night.   3 hypothyroidism-continue Synthroid   4 BPH-continue Flomax.  5 thrombocytopenia-etiology unclear. platelet count 108 and has improved. Patient is on Lovenox daily for DVT prophylaxis and will need to continue to monitor daily.  There is no  evidence of acute bleeding presently.  6 DM - diet controlled at home. Blood sugars are labile here due to the IV steroids.  Continue sliding scale insulin for now  and  3 units NovoLog 3  times a day.  . Management plans discussed with the patient. CODE STATUS: Full  DVT Prophylaxis: Lovenox  TOTAL TIME TAKING CARE OF THIS PATIENT: 24 minutes.   POSSIBLE D/C tomorrow to skilled nursing facility, DEPENDING ON CLINICAL CONDITION.   Darrall Strey M.D on 11/07/2015 at 9:48 AM  Between 7am to 6pm - Pager - [phone]  After 6pm go to [email or web address] - password [email or web address] [email or web address]  [facility]  Office  [phone]  CC: Primary care physician; No primary care provider on file.

## 2015-11-07 NOTE — Progress Notes (Signed)
Physical Therapy Treatment Patient Details Name: Jimmy Johnson MRN: [id number] DOB: 03/02/1927 Today's Date: 11/07/2015    History of Present Illness Patient is an 81 y/o male that presents with increasing shortness of breath from home.     PT Comments    Pt with mild confusion. A/O x 1 to person. Pt requires increased cueing and demonstration to follow commands for exercises edge of bed. Cueing required for safety awareness with transfers and ambulation as well and assist for balance and occasionally rolling walker maneuvering for safety. Session performed on room air with O2 saturation at 91% at lowest; finished with O2 saturation 92%. 1 liter O2 replaced while pt up in chair.  Pt receives up in chair comfortably and instructed several times on calling for assist to get up. Pt given nursing assistant number in large print close by and demonstrates use of phone to dial. Continue PT for progression of strength, endurance, balance and safety awareness for improved functional mobility.   Follow Up Recommendations  SNF     Equipment Recommendations       Recommendations for Other Services       Precautions / Restrictions Restrictions Weight Bearing Restrictions: No    Mobility  Bed Mobility Overal bed mobility: Needs Assistance Bed Mobility: Supine to Sit     Supine to sit: Supervision     General bed mobility comments: Use of rails and increased time/effort and supervision for safety  Transfers Overall transfer level: Needs assistance Equipment used: Rolling walker (2 wheeled) Transfers: Sit to/from Stand Sit to Stand: Min assist (requires steadying once in stand for upright balance)         General transfer comment: leans against bed for support to stand and static stand balance. Requires Min A initially to maintain upright balance without leaning posteriorly against bed  Ambulation/Gait Ambulation/Gait assistance: Min guard;Min assist Ambulation Distance (Feet): 40  Feet Assistive device: Rolling walker (2 wheeled) Gait Pattern/deviations: Step-to pattern;Shuffle;Decreased dorsiflexion - right;Decreased dorsiflexion - left;Staggering left;Staggering right Gait velocity: slow   General Gait Details: Unsteady at times especially on turns requiring Min A for balance and also requires assist to maneuver rw at times due to poor safety awareness and running into objects   Stairs            Wheelchair Mobility    Modified Rankin (Stroke Patients Only)       Balance Overall balance assessment: Needs assistance Sitting-balance support: Bilateral upper extremity supported Sitting balance-Leahy Scale: Fair     Standing balance support: Bilateral upper extremity supported Standing balance-Leahy Scale: Poor Standing balance comment: requires Min A at times with assistive device as well                    Cognition Arousal/Alertness: Awake/alert Behavior During Therapy: WFL for tasks assessed/performed Overall Cognitive Status: History of cognitive impairments - at baseline                      Exercises General Exercises - Lower Extremity Long Arc Quad: AROM;Both;20 reps;Seated (poor range; difficulty following instructions) Hip ABduction/ADduction: AROM;Both;20 reps;Seated (difficulty following instruction; requires cues/demonstrate) Hip Flexion/Marching: AROM;Both;20 reps;Seated (requires cueing and demonstration to follow) Toe Raises: AROM;Both;20 reps;Seated (requires increased cueing/demonstration) Heel Raises: AROM;Both;20 reps;Seated (requires cueing and demonstration to follow instruction)    General Comments        Pertinent Vitals/Pain Pain Assessment: No/denies pain    Home Living  Prior Function            PT Goals (current goals can now be found in the care plan section) Progress towards PT goals: Progressing toward goals    Frequency  Min 2X/week    PT Plan Current  plan remains appropriate    Co-evaluation             End of Session Equipment Utilized During Treatment: Gait belt Activity Tolerance: Patient limited by fatigue Patient left: in chair;with call bell/phone within reach;with chair alarm set     Time: 1135-1204 PT Time Calculation (min) (ACUTE ONLY): 29 min  Charges:  $Gait Training: 8-22 mins $Therapeutic Exercise: 8-22 mins                    G Codes:      Kristeen Miss 11/07/2015, 1:38 PM

## 2015-11-08 ENCOUNTER — Inpatient Hospital Stay: Payer: Medicare Other

## 2015-11-08 LAB — BASIC METABOLIC PANEL
ANION GAP: 7 (ref 5–15)
BUN: 20 mg/dL (ref 6–20)
CALCIUM: 8.7 mg/dL — AB (ref 8.9–10.3)
CO2: 30 mmol/L (ref 22–32)
Chloride: 102 mmol/L (ref 101–111)
Creatinine, Ser: 0.97 mg/dL (ref 0.61–1.24)
Glucose, Bld: 105 mg/dL — ABNORMAL HIGH (ref 65–99)
Potassium: 3.6 mmol/L (ref 3.5–5.1)
Sodium: 139 mmol/L (ref 135–145)

## 2015-11-08 LAB — GLUCOSE, CAPILLARY
GLUCOSE-CAPILLARY: 193 mg/dL — AB (ref 65–99)
GLUCOSE-CAPILLARY: 239 mg/dL — AB (ref 65–99)
Glucose-Capillary: 113 mg/dL — ABNORMAL HIGH (ref 65–99)
Glucose-Capillary: 204 mg/dL — ABNORMAL HIGH (ref 65–99)

## 2015-11-08 MED ORDER — LEVOFLOXACIN 750 MG PO TABS: 750.0000 mg | ORAL_TABLET | ORAL | Status: AC

## 2015-11-08 MED ORDER — RISPERIDONE 0.5 MG PO TBDP: 0.5000 mg | ORAL_TABLET | Freq: Two times a day (BID) | ORAL | Status: AC | PRN

## 2015-11-08 MED ORDER — IPRATROPIUM-ALBUTEROL 0.5-2.5 (3) MG/3ML IN SOLN: 3.0000 mL | Freq: Two times a day (BID) | RESPIRATORY_TRACT | Status: AC

## 2015-11-08 MED ORDER — RISPERIDONE 0.5 MG PO TBDP
0.5000 mg | ORAL_TABLET | Freq: Two times a day (BID) | ORAL | Status: DC | PRN
  Administered 2015-11-08 (×2): 0.5 mg via ORAL
  Filled 2015-11-08 (×2): qty 1

## 2015-11-08 MED ORDER — INSULIN ASPART 100 UNIT/ML ~~LOC~~ SOLN: 0.0000 [IU] | Freq: Every day | SUBCUTANEOUS | Status: DC

## 2015-11-08 MED ORDER — PREDNISONE 10 MG PO TABS: 10.0000 mg | ORAL_TABLET | Freq: Every day | ORAL | Status: AC

## 2015-11-08 MED ORDER — INSULIN ASPART 100 UNIT/ML ~~LOC~~ SOLN: 0.0000 [IU] | Freq: Three times a day (TID) | SUBCUTANEOUS | Status: DC

## 2015-11-08 NOTE — Progress Notes (Signed)
Notified Dr Sheryle Hailiamond for order for sitter. Pt is impulsive

## 2015-11-08 NOTE — Care Management Important Message (Signed)
Important Message  Patient Details  Name: Jimmy Johnson MRN: [id number] Date of Birth: 07/30/1927   Medicare Important Message Given:  Yes    Olegario MessierKathy A Derward Marple 11/08/2015, 9:38 AM

## 2015-11-08 NOTE — Progress Notes (Signed)
Initial Nutrition Assessment   INTERVENTION:   Meals and Snacks: Cater to patient preferences Medical Food Supplement Therapy: will send Sugar Free Mighty Shake and Magic Cup BID for added nutrition    NUTRITION DIAGNOSIS:   Inadequate oral intake related to acute illness as evidenced by meal completion < 50%.  GOAL:   Patient will meet greater than or equal to 90% of their needs  MONITOR:    (Energy Intake, Pulmonary Profile, Anthropometrics)  REASON FOR ASSESSMENT:   LOS    ASSESSMENT:   Pt admitted with SOB secondary to COPD exacerbation. Pt currently with 1:1 sitter as pt confused and impulsive at times.   Past Medical History  Diagnosis Date  . COPD (chronic obstructive pulmonary disease) (HCC)   . Diabetes mellitus without complication (HCC)      Diet Order:  Diet Carb Modified Fluid consistency:: Thin; Room service appropriate?: Yes   Current Nutrition: Pt sitter reports pt ate 50% of breakfast and lunch today. Sitter reports lunch was [place], mashed potatoes, fruit and tea for lunch. Limited documentation since admission of meals that are documented in I/O chart, however those that are documented fluctuate greatly from bites of meals to 100%.  Food/Nutrition-Related History: Per MST no decrease in appetite, unable to clarify with pt   Scheduled Medications:  . budesonide (PULMICORT) nebulizer solution  0.25 mg Nebulization BID  . docusate sodium  100 mg Oral BID  . enoxaparin (LOVENOX) injection  40 mg Subcutaneous Daily  . insulin aspart  0-5 Units Subcutaneous QHS  . insulin aspart  0-9 Units Subcutaneous TID WC  . insulin aspart  3 Units Subcutaneous TID WC  . ipratropium-albuterol  3 mL Nebulization BID  . levofloxacin  750 mg Oral Q24H  . levothyroxine  50 mcg Oral QAC breakfast  . loratadine  10 mg Oral Daily  . methylPREDNISolone (SOLU-MEDROL) injection  40 mg Intravenous Daily  . pantoprazole  40 mg Oral QAC breakfast  . sodium chloride  3 mL  Intravenous Q12H  . tamsulosin  0.4 mg Oral Daily     Electrolyte/Renal Profile and Glucose Profile:   Recent Labs Lab 11/03/15 2043 11/04/15 0447 11/08/15 0526  NA 138 139 139  K 4.0 4.2 3.6  CL 102 104 102  CO2 29 27 30   BUN 29* 28* 20  CREATININE 0.84 0.79 0.97  CALCIUM 8.7* 8.4* 8.7*  GLUCOSE 247* 324* 105*   Protein Profile: No results for input(s): ALBUMIN in the last 168 hours.  Gastrointestinal Profile: Last BM:  11/08/2015    Nutrition-Focused Physical Exam Findings:  Unable to complete Nutrition-Focused physical exam at this time.    Weight Change: Per MST no decrease in weight PTA, unable to clarify with pt     Height:   Ht Readings from Last 1 Encounters:  11/03/15 5\' 10"  (1.778 m)    Weight:   Wt Readings from Last 1 Encounters:  11/03/15 165 lb 12.8 oz (75.206 kg)     BMI:  Body mass index is 23.79 kg/(m^2).  Estimated Nutritional Needs:   Kcal:  BEE: 1431kcals, TEE: (IF 1.1-1.3)(AF 1.2) 1890-2233kcals  Protein:  75-90g protein (1.0-1.2g/kg)  Fluid:  1875-227250mL of fluid (25-4130mL/kg)  EDUCATION NEEDS:   No education needs identified at this time    MODERATE Care Level  Leda QuailAllyson Azan Maneri, RD, LDN Pager [phone] Weekend/On-Call Pager [phone]

## 2015-11-08 NOTE — Clinical Social Work Note (Signed)
Pt is ready for discharge to [facility], however patient has a 1:1 sitter. Pt needs to be sitter free for 24 hours prior to discharge in order to be accepted at SNF. CSW updated MD. RN and Charge RN are aware. CSW also updated facility. CSW will continue to follow.   Dede QuerySarah Geremy Rister, MSW, LCSW Clinical Social Worker  [phone]

## 2015-11-08 NOTE — Care Management (Signed)
Patient is currently 1:1 for safety concerns which usually delays discharge to SNF as SNF does not typically have this service. CSW following.

## 2015-11-08 NOTE — Discharge Summary (Addendum)
[facility] Physicians -  at [facility]   PATIENT NAME: Jimmy Johnson    MR#:  [id number]  DATE OF BIRTH:  [birth year]  DATE OF ADMISSION:  11/03/2015 ADMITTING PHYSICIAN: Ramonita Lab, MD  DATE OF DISCHARGE: 10/19/2015 PRIMARY CARE PHYSICIAN:  Charlaine Dalton   ADMISSION DIAGNOSIS:  Hypoxia [R09.02] Chronic obstructive pulmonary disease with acute exacerbation (HCC) [J44.1]  DISCHARGE DIAGNOSIS:  Active Problems:   COPD with acute exacerbation (HCC)   SECONDARY DIAGNOSIS:   Past Medical History  Diagnosis Date  . COPD (chronic obstructive pulmonary disease) (HCC)   . Diabetes mellitus without complication [facility])     HOSPITAL COURSE:    81 year old male with past medical history of COPD, hypothyroidism, GERD, BPH who presented to the hospital due to weakness lethargy and noted to be in COPD exacerbation.  1 COPD exacerbation: Patient has improved since admission. He is still having some mild expiratory wheezing although much improved. It appears that as an outpatient he also has had difficulty with COPD/ASTHMA adn has been on QOD 10 mg of prednisone. He will be discharged on steroids taper then resume his home prednisone regimen.  Apparently he has not been able to use INHALERS in the past apropriately.  2 AMS: patient has underlying dementia and his confusion has worsened due to being in the hospital and COPD exacerbation. RISPERDAL PRN willb e RX at discharge. He had a sitter here, but has not been combative.  3 hypothyroidism-continue Synthroid   4 BPH-continue Flomax.  5 thrombocytopenia-etiology unclear. platelet count  has improved. There is no evidence of acute bleeding presently.  6 DM - diet controlled at home. Blood sugars are labile here due to the IV steroids. He was on sliding scale insulin and 3 units NovoLog 3 times a day. His blood sugars should be monitored at the SNF.  DISCHARGE CONDITIONS AND DIET:   Stable  condition diabetic  CONSULTS OBTAINED:  Treatment Team:  Adrian Saran, MD  DRUG ALLERGIES:  No Known Allergies  DISCHARGE MEDICATIONS:   Current Discharge Medication List    START taking these medications   Details  levofloxacin (LEVAQUIN) 750 MG tablet Take 1 tablet (750 mg total) by mouth daily. Qty: 4 tablet, Refills: 0    risperiDONE (RISPERDAL M-TABS) 0.5 MG disintegrating tablet Take 1 tablet (0.5 mg total) by mouth 2 (two) times daily as needed (agitation). Qty: 30 tablet, Refills: 0    Sliding scale insulin DUONEBS BID  PREDNSIONE TAPER AS DIRECTED THEN prednisone 10 mg every other day  CONTINUE these medications which have CHANGED   Details  predniSONE (DELTASONE) 10 MG tablet Taper as directed then 10 mg every other day       CONTINUE these medications which have NOT CHANGED   Details  cetirizine (ZYRTEC) 10 MG tablet Take 10 mg by mouth daily.    levothyroxine (SYNTHROID, LEVOTHROID) 50 MCG tablet Take 50 mcg by mouth daily before breakfast.    omeprazole (PRILOSEC) 20 MG capsule Take 20 mg by mouth daily.    tamsulosin (FLOMAX) 0.4 MG CAPS capsule Take 0.4 mg by mouth.              Today   CHIEF COMPLAINT:  No acute issues overnight and is 92% RA Sitter at bedside no agitatation, just impulsive behavior as per nusring   VITAL SIGNS:  Blood pressure 117/73, pulse 95, temperature 98 F (36.7 C), temperature source Oral, resp. rate 18, height  (1.778 m), weight 75.206 kg (165 lb  12.8 oz), SpO2 92 %.   REVIEW OF SYSTEMS:  Review of Systems  Constitutional: Negative for fever, chills and malaise/fatigue.  HENT: Negative for sore throat.   Eyes: Negative for blurred vision.  Respiratory: Positive for cough. Negative for hemoptysis, shortness of breath and wheezing.   Cardiovascular: Negative for chest pain, palpitations and leg swelling.  Gastrointestinal: Negative for nausea, vomiting, abdominal pain, diarrhea and blood in stool.   Genitourinary: Negative.  Negative for dysuria.  Musculoskeletal: Negative for back pain.  Neurological: Negative for dizziness, tremors, sensory change, speech change and headaches.  Endo/Heme/Allergies: Does not bruise/bleed easily.  Psychiatric/Behavioral: Positive for memory loss.     PHYSICAL EXAMINATION:  GENERAL:  81 y.o.-year-old patient lying in the bed with no acute distress.  NECK:  Supple, no jugular venous distention. No thyroid enlargement, no tenderness.  LUNGS: exp wheezing, NO rales,rhonchi  No use of accessory muscles of respiration.  CARDIOVASCULAR: S1, S2 normal. No murmurs, rubs, or gallops.  ABDOMEN: Soft, non-tender, non-distended. Bowel sounds present. No organomegaly or mass.  EXTREMITIES: No pedal edema, cyanosis, or clubbing.  PSYCHIATRIC: The patient is alert and oriented x name SKIN: No obvious rash, lesion, or ulcer.   DATA REVIEW:   CBC  Recent Labs Lab 11/07/15 0545  WBC 10.0  HGB 13.7  HCT 39.0*  PLT 109*    Chemistries   Recent Labs Lab 11/08/15 0526  NA 139  K 3.6  CL 102  CO2 30  GLUCOSE 105*  BUN 20  CREATININE 0.97  CALCIUM 8.7*    Cardiac Enzymes  Recent Labs Lab 11/03/15 2043  TROPONINI <0.03    Microbiology Results  @MICRORSLT48 @  RADIOLOGY:  No results found.    Stable for discharge  Left message for daughter [phone] Patient should follow up with PCP in 1 week  CODE STATUS:     Code Status Orders        Start     Ordered   11/04/15 0205  Full code   Continuous     11/04/15 0204      TOTAL TIME TAKING CARE OF THIS PATIENT: 35 minutes.    Note: This dictation was prepared with Dragon dictation along with smaller phrase technology. Any transcriptional errors that result from this process are unintentional.  Tlaloc Taddei M.D on 11/08/2015 at 8:54 AM  Between 7am to 6pm - Pager - [phone] After 6pm go to [email or web address] - password [email or web address] [facility]  [facility]  Office   [phone]  CC: Primary care physician; No primary care provider on file.

## 2015-11-09 LAB — GLUCOSE, CAPILLARY
GLUCOSE-CAPILLARY: 171 mg/dL — AB (ref 65–99)
Glucose-Capillary: 150 mg/dL — ABNORMAL HIGH (ref 65–99)
Glucose-Capillary: 189 mg/dL — ABNORMAL HIGH (ref 65–99)
Glucose-Capillary: 243 mg/dL — ABNORMAL HIGH (ref 65–99)

## 2015-11-09 MED ORDER — INSULIN ASPART 100 UNIT/ML ~~LOC~~ SOLN
4.0000 [IU] | Freq: Three times a day (TID) | SUBCUTANEOUS | Status: DC
  Administered 2015-11-09 – 2015-11-10 (×3): 4 [IU] via SUBCUTANEOUS
  Filled 2015-11-09 (×3): qty 4

## 2015-11-09 MED ORDER — SODIUM CHLORIDE 0.9 % IV BOLUS (SEPSIS)
250.0000 mL | Freq: Once | INTRAVENOUS | Status: AC
  Administered 2015-11-09: 250 mL via INTRAVENOUS

## 2015-11-09 NOTE — Care Management Important Message (Signed)
Important Message  Patient Details  Name: Jimmy Johnson MRN: [id number] Date of Birth: 07/17/1927   Medicare Important Message Given:  Yes    Alexzandra Bilton A, RN 11/09/2015, 9:44 AM

## 2015-11-09 NOTE — Progress Notes (Signed)
Physical Therapy Treatment Patient Details Name: Jimmy Johnson MRN: [id number] DOB: 07/04/1927 Today's Date: 11/09/2015    History of Present Illness Patient is an 81 y/o male that presents with increasing shortness of breath from home.     PT Comments    Pt very sleepy this AM, however no sitter present. Pt able to participate with limited therex however is not safe for mobility this date secondary to lethargic status. Still needs assistance for completion of there-ex.  Follow Up Recommendations  SNF     Equipment Recommendations       Recommendations for Other Services       Precautions / Restrictions Precautions Precautions: Fall Restrictions Weight Bearing Restrictions: No    Mobility  Bed Mobility               General bed mobility comments: attempted, however pt very lethargic, unable to keep eyes open. Mobility not performed this date.  Transfers                    Ambulation/Gait                 Stairs            Wheelchair Mobility    Modified Rankin (Stroke Patients Only)       Balance                                    Cognition Arousal/Alertness: Lethargic Behavior During Therapy: WFL for tasks assessed/performed Overall Cognitive Status: History of cognitive impairments - at baseline                      Exercises Other Exercises Other Exercises: supine exercises performed with mod assist x 10 reps  on B LE secondary to lethargy. Exercises including ankle pumps, SLRs, and hip abd/add. Pt resists therapist at times, however is able to participate.    General Comments        Pertinent Vitals/Pain Pain Assessment: No/denies pain    Home Living                      Prior Function            PT Goals (current goals can now be found in the care plan section) Acute Rehab PT Goals Patient Stated Goal: not stated PT Goal Formulation: Patient unable to participate in goal  setting Time For Goal Achievement: 11/19/15 Potential to Achieve Goals: Fair Progress towards PT goals: Progressing toward goals    Frequency  Min 2X/week    PT Plan Current plan remains appropriate    Co-evaluation             End of Session   Activity Tolerance: Patient limited by lethargy Patient left: in bed;with bed alarm set     Time: [id number] PT Time Calculation (min) (ACUTE ONLY): 8 min  Charges:  $Therapeutic Exercise: 8-22 mins                    G Codes:      Rockie Schnoor 11/09/2015, 11:20 AM  Elizabeth PalauStephanie Zuha Dejonge, PT, DPT [phone]

## 2015-11-09 NOTE — Progress Notes (Signed)
Pt lethargic today.  More awake as the day progressed.  BP  Low.  MD ordered a bolus of NS

## 2015-11-09 NOTE — Progress Notes (Signed)
[facility] [place] at [facility]   PATIENT NAME: Jimmy Johnson    MR#:  [id number]  DATE OF BIRTH:  09/15/1927  SUBJECTIVE:   Patient had a sitter last night. He is not agitated but impulsive.  REVIEW OF SYSTEMS:    Review of Systems  Constitutional: Negative for fever and chills.  HENT: Negative for congestion and tinnitus.   Eyes: Negative for blurred vision and double vision.  Respiratory: Positive for cough. Negative for hemoptysis, sputum production, shortness of breath and wheezing.   Cardiovascular: Negative for chest pain, orthopnea, leg swelling and PND.  Gastrointestinal: Negative for nausea, vomiting, abdominal pain and diarrhea.  Genitourinary: Negative for dysuria and hematuria.  Neurological: Negative for dizziness, sensory change and focal weakness.  All other systems reviewed and are negative.   Nutrition: Regular Tolerating Diet: Yes   DRUG ALLERGIES:  No Known Allergies  VITALS:  Blood pressure 113/77, pulse 80, temperature 98.3 F (36.8 C), temperature source Oral, resp. rate 20, height 5\' 10"  (1.778 m), weight 75.206 kg (165 lb 12.8 oz), SpO2 93 %.  PHYSICAL EXAMINATION:   Physical Exam  Constitutional: He is well-developed, well-nourished, and in no distress. No distress.  HENT:  Head: Normocephalic.  Eyes: No scleral icterus.  Neck: Normal range of motion. Neck supple. No JVD present. No tracheal deviation present.  Cardiovascular: Normal rate, regular rhythm and normal heart sounds.  Exam reveals no gallop and no friction rub.   No murmur heard. Pulmonary/Chest: Effort normal. No respiratory distress. He has wheezes. He has no rales. He exhibits no tenderness.  Abdominal: Soft. Bowel sounds are normal. He exhibits no distension and no mass. There is no tenderness. There is no rebound and no guarding.  Musculoskeletal: Normal range of motion. He exhibits no edema.  Neurological: He is alert.  Skin: Skin is warm. No rash  noted. No erythema.       LABORATORY PANEL:   CBC  Recent Labs Lab 11/07/15 0545  WBC 10.0  HGB 13.7  HCT 39.0*  PLT 109*   ------------------------------------------------------------------------------------------------------------------  Chemistries   Recent Labs Lab 11/08/15 0526  NA 139  K 3.6  CL 102  CO2 30  GLUCOSE 105*  BUN 20  CREATININE 0.97  CALCIUM 8.7*   ------------------------------------------------------------------------------------------------------------------  Cardiac Enzymes  Recent Labs Lab 11/03/15 2043  TROPONINI <0.03   ------------------------------------------------------------------------------------------------------------------  RADIOLOGY:  Dg Chest 1 View  11/08/2015  CLINICAL DATA:  Patient with COPD. Shortness of breath and productive cough. EXAM: CHEST 1 VIEW COMPARISON:  Chest radiograph 11/03/2015. FINDINGS: Stable enlarged cardiac and mediastinal contours. Low lung volumes. Heterogeneous opacities left greater than right lung base. No pleural effusion or pneumothorax. IMPRESSION: Low lung volumes with left-greater-than-right basilar airspace opacities favored to represent atelectasis. Infection not excluded. Electronically Signed   By: Annia Beltrew  Davis M.D.   On: 11/08/2015 09:09     ASSESSMENT AND PLAN:   81 year old male with past medical history of COPD, hypothyroidism, GERD, BPH who presented to the hospital due to weakness lethargy and noted to be in COPD exacerbation.  1 COPD exacerbation: Patient has improved since admission. He is still having expiratory wheezing although improved since yesterday. It does appear the patient may have some baseline COPD. Continue 40 mg IV Solu-Medro while the patient is in the hospital. Continue inhalers and nebulizer treatments.  2 AMS:  patient has underlying dementia and his confusion has worsened due to being in the hospital and COPD exacerbation. Continue to reorient the patient.  Continue to have the patient awakened the daytime and sleeps at night.  Patient no longer need sitter. This is discontinued. 3 hypothyroidism-continue Synthroid   4 BPH-continue Flomax.  5 thrombocytopenia-etiology unclear. platelet count has improved. Patient is on Lovenox daily for DVT prophylaxis and will need to continue to monitor daily.  There is no  evidence of acute bleeding presently.  6 DM - diet controlled at home. Blood sugars are labile here due to the IV steroids. Continue sliding scale insulin for now  and NovoLog 3 times a day.  Patient is stable for discharge. Patient needs 24 hours without a sitter to discharge to skilled nursing facility. Management plans discussed with the patient. CODE STATUS: Full  DVT Prophylaxis: Lovenox  TOTAL TIME TAKING CARE OF THIS PATIENT: 24 minutes.   POSSIBLE D/C tomorrow to skilled nursing facility,    Ahmia Colford M.D on 11/09/2015 at 11:05 AM  Between 7am to 6pm - Pager - [phone]  After 6pm go to [email or web address] - password [email or web address] [facility]  [place] Hospital[facility]  Office  [phone]  CC: Primary care physician; No primary care provider on file.

## 2015-11-09 NOTE — Progress Notes (Signed)
BP low.  Dr Renae GlossWieting informed.  He said he would order a fluid bolus

## 2015-11-09 NOTE — Progress Notes (Signed)
Inpatient Diabetes Program Recommendations  AACE/ADA: New Consensus Statement on Inpatient Glycemic Control (2015)  Target Ranges:  Prepandial:   less than 140 mg/dL      Peak postprandial:   less than 180 mg/dL (1-2 hours)      Critically ill patients:  140 - 180 mg/dL   Review of Glycemic Control  Results for Jimmy Johnson, Jimmy Johnson (MRN [id number]) as of 11/09/2015 10:55  Ref. Range 11/07/2015 11:12 11/07/2015 16:10 11/07/2015 21:23 11/08/2015 07:33 11/08/2015 11:32 11/08/2015 16:45 11/08/2015 20:49 11/09/2015 07:44  Glucose-Capillary Latest Ref Range: 65-99 mg/dL 132117 (H) 440118 (H) 102218 (H) 113 (H) 204 (H) 193 (H) 239 (H) 150 (H)    Diabetes history: DM2 Outpatient Diabetes medications: None; diet controlled Current orders for Inpatient glycemic control: Novolog 0-9 units TID with meals, Novolog 0-5 units HS, Novolog 3 units with meals  Inpatient Diabetes Program Recommendations: Since the patient is eating better now and the steroids continue, please consider increasing Novolog to 4 units TID with meals for meal coverage (in addition to Novolog correction scale).  Susette RacerJulie Daylen Lipsky, RN, BA, MHA, CDE Diabetes Coordinator Inpatient Diabetes Program  [phone] (Team Pager) [phone] [facility] Office) 11/09/2015 10:56 AM

## 2015-11-10 LAB — CBC WITH DIFFERENTIAL/PLATELET
BASOS ABS: 0 10*3/uL (ref 0–0.1)
BASOS PCT: 0 %
Eosinophils Absolute: 0.1 10*3/uL (ref 0–0.7)
Eosinophils Relative: 1 %
HEMATOCRIT: 44.3 % (ref 40.0–52.0)
HEMOGLOBIN: 15.3 g/dL (ref 13.0–18.0)
LYMPHS PCT: 6 %
Lymphs Abs: 0.9 10*3/uL — ABNORMAL LOW (ref 1.0–3.6)
MCH: 32.2 pg (ref 26.0–34.0)
MCHC: 34.5 g/dL (ref 32.0–36.0)
MCV: 93.4 fL (ref 80.0–100.0)
MONO ABS: 0.8 10*3/uL (ref 0.2–1.0)
Monocytes Relative: 6 %
NEUTROS ABS: 12.8 10*3/uL — AB (ref 1.4–6.5)
NEUTROS PCT: 87 %
Platelets: 152 10*3/uL (ref 150–440)
RBC: 4.75 MIL/uL (ref 4.40–5.90)
RDW: 13.4 % (ref 11.5–14.5)
WBC: 14.6 10*3/uL — ABNORMAL HIGH (ref 3.8–10.6)

## 2015-11-10 LAB — CREATININE, SERUM: Creatinine, Ser: 0.94 mg/dL (ref 0.61–1.24)

## 2015-11-10 LAB — GLUCOSE, CAPILLARY: GLUCOSE-CAPILLARY: 163 mg/dL — AB (ref 65–99)

## 2015-11-10 MED ORDER — INSULIN ASPART 100 UNIT/ML ~~LOC~~ SOLN: 4.0000 [IU] | Freq: Three times a day (TID) | SUBCUTANEOUS | Status: AC

## 2015-11-10 NOTE — Progress Notes (Signed)
Clinical Social Worker informed Bretta BangbySital Mody, MD that patient is medically ready to discharge to SNF, Patient and  Daughter Harriett Sineancy are in a agreement with plan.  Call to [facility] to confirm that patient's bed is ready. Provided patient's room number 90 and number to call for report [phone] . All discharge information faxed to  Facility. Rx's added to discharge packet.   RN will call report and patient [facility].  Sammuel Hineseborah Floyde Dingley. LCSWA Clinical Social Work Department [phone] 9:22 AM

## 2015-11-10 NOTE — Progress Notes (Signed)
Patient discharging to [facility]. IV removed. Report called to facility. EMS called. Waiting on transport.

## 2015-11-10 NOTE — Discharge Summary (Addendum)
[facility] - [place] at [facility]   PATIENT NAME: Jimmy Johnson    MR#:  [id number]  DATE OF BIRTH:  05/28/1927  DATE OF ADMISSION:  11/03/2015 ADMITTING PHYSICIAN: Ramonita LabAruna Gouru, MD  DATE OF DISCHARGE: 11/10/2015 PRIMARY CARE PHYSICIAN:  Charlaine DaltonJOHN KIZER   ADMISSION DIAGNOSIS:  Hypoxia [R09.02] Chronic obstructive pulmonary disease with acute exacerbation (HCC) [J44.1]  DISCHARGE DIAGNOSIS:  Active Problems:   COPD with acute exacerbation (HCC)   SECONDARY DIAGNOSIS:   Past Medical History  Diagnosis Date  . COPD (chronic obstructive pulmonary disease) (HCC)   . Diabetes mellitus without complication [facility](HCC)     HOSPITAL COURSE:    81 year old male with past medical history of COPD, hypothyroidism, GERD, BPH who presented to the hospital due to weakness lethargy and noted to be in COPD exacerbation.  1 COPD exacerbation: Patient has improved since admission. He is still having some mild expiratory wheezing although much improved. It appears that as an outpatient he also has had difficulty with COPD/ASTHMA and has been on QOD 10 mg of prednisone. He will be discharged on steroids taper then resume his home prednisone regimen.  Apparently he has not been able to use INHALERS in the past apropriately.  2 AMS: patient has underlying dementia and his confusion has worsened due to being in the hospital and COPD exacerbation. RISPERDAL PRN will be RX at discharge. .  3 hypothyroidism-continue Synthroid   4 BPH-continue Flomax.  5 thrombocytopenia-etiology unclear. platelet count  has improved. There is no evidence of acute bleeding presently.  6 DM - diet controlled at home. Blood sugars are labile here due to the IV steroids. He was on sliding scale insulin and4  units NovoLog 3 times a day. His blood sugars should be monitored at the SNF.  DISCHARGE CONDITIONS AND DIET:   Stable condition diabetic  CONSULTS OBTAINED:  Treatment Team:  Adrian SaranSital Khilynn Borntreger,  MD  DRUG ALLERGIES:  No Known Allergies  DISCHARGE MEDICATIONS:   Current Discharge Medication List    START taking these medications   Details  levofloxacin (LEVAQUIN) 750 MG tablet Take 1 tablet (750 mg total) by mouth daily. Qty: 4 tablet, Refills: 0    risperiDONE (RISPERDAL M-TABS) 0.5 MG disintegrating tablet Take 1 tablet (0.5 mg total) by mouth 2 (two) times daily as needed (agitation). Qty: 30 tablet, Refills: 0    Sliding scale insulin DUONEBS TWICE A DAY  INSULIN NOVOLOG 4 units three time a day  PREDNSIONE TAPER AS DIRECTED THEN prednisone 10 mg every other day  CONTINUE these medications which have CHANGED   Details  predniSONE (DELTASONE) 10 MG tablet Taper as directed then 10 mg every other day       CONTINUE these medications which have NOT CHANGED   Details  cetirizine (ZYRTEC) 10 MG tablet Take 10 mg by mouth daily.    levothyroxine (SYNTHROID, LEVOTHROID) 50 MCG tablet Take 50 mcg by mouth daily before breakfast.    omeprazole (PRILOSEC) 20 MG capsule Take 20 mg by mouth daily.    tamsulosin (FLOMAX) 0.4 MG CAPS capsule Take 0.4 mg by mouth.              Today   CHIEF COMPLAINT:  Patient doing well this am. No acute issues over night  VITAL SIGNS:  Blood pressure 119/61, pulse 88, temperature 98.1 F (36.7 C), temperature source Oral, resp. rate 18, height 5\' 10"  (1.778 m), weight 75.206 kg (165 lb 12.8 oz), SpO2 96 %.   REVIEW  OF SYSTEMS:  Review of Systems  Constitutional: Negative for fever, chills and malaise/fatigue.  HENT: Negative for sore throat.   Eyes: Negative for blurred vision.  Respiratory: Negative for hemoptysis, shortness of breath and wheezing.   Cardiovascular: Negative for chest pain, palpitations and leg swelling.  Gastrointestinal: Negative for nausea, vomiting, abdominal pain, diarrhea and blood in stool.  Genitourinary: Negative.  Negative for dysuria.  Musculoskeletal: Negative for back pain.   Neurological: Negative for dizziness, tremors, sensory change, speech change and headaches.  Endo/Heme/Allergies: Does not bruise/bleed easily.  Psychiatric/Behavioral: Positive for memory loss.     PHYSICAL EXAMINATION:  GENERAL:  81 y.o.-year-old patient lying in the bed with no acute distress.  NECK:  Supple, no jugular venous distention. No thyroid enlargement, no tenderness.  LUNGS:mild exp wheezing good airflow, NO rales,rhonchi  No use of accessory muscles of respiration.  CARDIOVASCULAR: S1, S2 normal. No murmurs, rubs, or gallops.  ABDOMEN: Soft, non-tender, non-distended. Bowel sounds present. No organomegaly or mass.  EXTREMITIES: No pedal edema, cyanosis, or clubbing.  PSYCHIATRIC: The patient is alert and oriented x name SKIN: No obvious rash, lesion, or ulcer.   DATA REVIEW:   CBC  Recent Labs Lab 11/10/15 0426  WBC 14.6*  HGB 15.3  HCT 44.3  PLT 152    Chemistries   Recent Labs Lab 11/08/15 0526 11/10/15 0426  NA 139  --   K 3.6  --   CL 102  --   CO2 30  --   GLUCOSE 105*  --   BUN 20  --   CREATININE 0.97 0.94  CALCIUM 8.7*  --     Cardiac Enzymes  Recent Labs Lab 11/03/15 2043  TROPONINI <0.03    Microbiology Results  @  RADIOLOGY:  Dg Chest 1 View  11/08/2015  CLINICAL DATA:  Patient with COPD. Shortness of breath and productive cough. EXAM: CHEST 1 VIEW COMPARISON:  Chest radiograph 11/03/2015. FINDINGS: Stable enlarged cardiac and mediastinal contours. Low lung volumes. Heterogeneous opacities left greater than right lung base. No pleural effusion or pneumothorax. IMPRESSION: Low lung volumes with left-greater-than-right basilar airspace opacities favored to represent atelectasis. Infection not excluded. Electronically Signed   By: Annia Belt M.D.   On: 11/08/2015 09:09      Stable for discharge  Left message for daughter [phone] Patient should follow up with PCP in 1 week  CODE STATUS:     Code Status  Orders        Start     Ordered   11/04/15 0205  Full code   Continuous     11/04/15 0204      TOTAL TIME TAKING CARE OF THIS PATIENT: 35 minutes.    Note: This dictation was prepared with Dragon dictation along with smaller phrase technology. Any transcriptional errors that result from this process are unintentional.  Drakkar Medeiros M.D on 11/10/2015 at 8:30 AM  Between 7am to 6pm - Pager - [phone] After 6pm go to [email or web address] - password [email or web address] [facility]  [place]  Hospitalists  Office  [phone]  CC: Primary care physician; No primary care provider on file.

## 2015-11-10 NOTE — Clinical Social Work Placement (Signed)
   CLINICAL SOCIAL WORK PLACEMENT  NOTE  Date:  11/10/2015  Patient Details  Name: Jimmy Johnson MRN: [id number] Date of Birth: 01/10/1927  Clinical Social Work is seeking post-discharge placement for this patient at the Skilled  Nursing Facility level of care (*CSW will initial, date and re-position this form in  chart as items are completed):  Yes   Patient/family provided with [place] Clinical Social Work Department's list of facilities offering this level of care within the geographic area requested by the patient (or if unable, by the patient's family).  Yes   Patient/family informed of their freedom to choose among providers that offer the needed level of care, that participate in Medicare, Medicaid or managed care program needed by the patient, have an available bed and are willing to accept the patient.  Yes   Patient/family informed of [facility]'s ownership interest in [facility] and [facility], as well as of the fact that they are under no obligation to receive care at these facilities.  PASRR submitted to EDS on 11/06/15     PASRR number received on 11/06/15     Existing PASRR number confirmed on       FL2 transmitted to all facilities in geographic area requested by pt/family on 11/06/15     FL2 transmitted to all facilities within larger geographic area on       Patient informed that his/her managed care company has contracts with or will negotiate with certain facilities, including the following:        Yes   Patient/family informed of bed offers received.  Patient chooses bed at  [facility])     Physician recommends and patient chooses bed at      Patient to be transferred to  [facility] on 11/10/15.  Patient to be transferred to facility by  (EMS)     Patient family notified on 11/10/15 of transfer.  Name of family member notified:   ([name])     PHYSICIAN       Additional Comment:     _______________________________________________ [name], Niley Helbig H, LCSW 11/10/2015, 9:21 AM

## 2017-09-20 ENCOUNTER — Emergency Department: Payer: Medicare Other

## 2017-09-20 ENCOUNTER — Emergency Department
Admission: EM | Admit: 2017-09-20 | Discharge: 2017-09-20 | Disposition: A | Discharge status: Home / Self Care | Payer: Medicare Other | Attending: Emergency Medicine | Admitting: Emergency Medicine

## 2017-09-20 DIAGNOSIS — R531 Weakness: Secondary | ICD-10-CM | POA: Diagnosis not present

## 2017-09-20 DIAGNOSIS — F0390 Unspecified dementia without behavioral disturbance: Secondary | ICD-10-CM | POA: Insufficient documentation

## 2017-09-20 DIAGNOSIS — Z794 Long term (current) use of insulin: Secondary | ICD-10-CM | POA: Insufficient documentation

## 2017-09-20 DIAGNOSIS — Z87891 Personal history of nicotine dependence: Secondary | ICD-10-CM | POA: Diagnosis not present

## 2017-09-20 DIAGNOSIS — Z79899 Other long term (current) drug therapy: Secondary | ICD-10-CM | POA: Insufficient documentation

## 2017-09-20 DIAGNOSIS — J449 Chronic obstructive pulmonary disease, unspecified: Secondary | ICD-10-CM | POA: Diagnosis not present

## 2017-09-20 DIAGNOSIS — E119 Type 2 diabetes mellitus without complications: Secondary | ICD-10-CM | POA: Diagnosis not present

## 2017-09-20 LAB — URINALYSIS, COMPLETE (UACMP) WITH MICROSCOPIC
Bacteria, UA: NONE SEEN
Bilirubin Urine: NEGATIVE
Glucose, UA: NEGATIVE mg/dL
Hgb urine dipstick: NEGATIVE
Ketones, ur: NEGATIVE mg/dL
Leukocytes, UA: NEGATIVE
Nitrite: NEGATIVE
Protein, ur: NEGATIVE mg/dL
SPECIFIC GRAVITY, URINE: 1.011 (ref 1.005–1.030)
SQUAMOUS EPITHELIAL / LPF: NONE SEEN
pH: 5 (ref 5.0–8.0)

## 2017-09-20 LAB — BASIC METABOLIC PANEL
Anion gap: 11 (ref 5–15)
BUN: 25 mg/dL — AB (ref 6–20)
CHLORIDE: 101 mmol/L (ref 101–111)
CO2: 29 mmol/L (ref 22–32)
CREATININE: 0.94 mg/dL (ref 0.61–1.24)
Calcium: 9.3 mg/dL (ref 8.9–10.3)
GFR calc Af Amer: >60 mL/min (ref >60)
GFR calc non Af Amer: >60 mL/min (ref >60)
GLUCOSE: 117 mg/dL — AB (ref 65–99)
Potassium: 4.1 mmol/L (ref 3.5–5.1)
Sodium: 141 mmol/L (ref 135–145)

## 2017-09-20 LAB — CBC
HCT: 42.9 % (ref 40.0–52.0)
Hemoglobin: 14.5 g/dL (ref 13.0–18.0)
MCH: 32.5 pg (ref 26.0–34.0)
MCHC: 33.8 g/dL (ref 32.0–36.0)
MCV: 96.2 fL (ref 80.0–100.0)
PLATELETS: 157 10*3/uL (ref 150–440)
RBC: 4.46 MIL/uL (ref 4.40–5.90)
RDW: 14.5 % (ref 11.5–14.5)
WBC: 7.7 10*3/uL (ref 3.8–10.6)

## 2017-09-20 LAB — TROPONIN I: Troponin I: <0.03 ng/mL (ref <0.03)

## 2017-09-20 MED ORDER — SODIUM CHLORIDE 0.9 % IV BOLUS (SEPSIS)
500.0000 mL | Freq: Once | INTRAVENOUS | Status: AC
  Administered 2017-09-20: 500 mL via INTRAVENOUS

## 2017-09-20 NOTE — Discharge Instructions (Signed)
Return to the emergency department immediately for new or worsening weakness, worsening change in mental status, fevers, vomiting, difficulty breathing, or any other new or worsening symptoms that concern you.  Mr. Jimmy Johnson should be followed by his primary care doctor and have physical therapy evaluation at home as planned.

## 2017-09-20 NOTE — ED Notes (Signed)
Pt cleaned up prior to departure 

## 2017-09-20 NOTE — ED Provider Notes (Signed)
[facility] Emergency Department Provider Note ____________________________________________   First MD Initiated Contact with Patient 09/20/17 1526     (approximate)  I have reviewed the triage vital signs and the nursing notes.   HISTORY  Chief Complaint Weakness  History of present illness severely limited by dementia.  HPI Jimmy Johnson is a 83 y.o. male with history of dementia, COPD/asthma, and diabetes who presents with worsening generalized weakness over the last 1-2 days, relatively acute in onset, associated with difficulty walking and decreased responsiveness.  Per patient's primary caregiver, patient normally is severely demented and usually not extremely active, but has been less active than usual, could not walk when assisted with the walker, and has had decreased p.o. intake over the last several days.  She also reports that patient had apparent hallucinations and was yelling out to dead family members this morning.  Patient is unable to provide any history.   Past Medical History:  Diagnosis Date  . COPD (chronic obstructive pulmonary disease) (HCC)   . Diabetes mellitus without complication [facility](HCC)     Patient Active Problem List   Diagnosis Date Noted  . COPD with acute exacerbation (HCC) 11/04/2015    Past Surgical History:  Procedure Laterality Date  . FOOT SURGERY      Prior to Admission medications   Medication Sig Start Date End Date Taking? Authorizing Provider  cetirizine (ZYRTEC) 10 MG tablet Take 10 mg by mouth daily.    [provider]  insulin aspart (NOVOLOG) 100 UNIT/ML injection Inject 4 Units into the skin 3 (three) times daily with meals. 11/10/15   Adrian SaranMody, Sital, MD  ipratropium-albuterol (DUONEB) 0.5-2.5 (3) MG/3ML SOLN Take 3 mLs by nebulization 2 (two) times daily. 11/08/15   Adrian SaranMody, Sital, MD  levofloxacin (LEVAQUIN) 750 MG tablet Take 1 tablet (750 mg total) by mouth daily. 11/08/15   Adrian SaranMody, Sital, MD    levothyroxine (SYNTHROID, LEVOTHROID) 50 MCG tablet Take 50 mcg by mouth daily before breakfast.    [provider]  omeprazole (PRILOSEC) 20 MG capsule Take 20 mg by mouth daily.    [provider]  predniSONE (DELTASONE) 10 MG tablet Take 1 tablet (10 mg total) by mouth daily with breakfast. 11/08/15   Adrian SaranMody, Sital, MD  risperiDONE (RISPERDAL M-TABS) 0.5 MG disintegrating tablet Take 1 tablet (0.5 mg total) by mouth 2 (two) times daily as needed (agitation). 11/08/15   Adrian SaranMody, Sital, MD  tamsulosin (FLOMAX) 0.4 MG CAPS capsule Take 0.4 mg by mouth.    [provider]    Allergies Patient has no known allergies.  No family history on file.  Social History Social History   Tobacco Use  . Smoking status: Former Smoker  Substance Use Topics  . Alcohol use: No  . Drug use: No    Review of Systems Level V caveat: Unable to obtain review of systems due to dementia    ____________________________________________   PHYSICAL EXAM:  VITAL SIGNS: ED Triage Vitals  Enc Vitals Group     BP 09/20/17 1431 93/63     Pulse Rate 09/20/17 1431 78     Resp 09/20/17 1431 19     Temp 09/20/17 1431 98.7 F (37.1 C)     Temp Source 09/20/17 1431 Rectal     SpO2 09/20/17 1431 100 %     Weight 09/20/17 1439 165 lb (74.8 kg)     Height --      Head Circumference --      Peak  Flow --      Pain Score --      Pain Loc --      Pain Edu? --      Excl. in GC? --     Constitutional: Alert.  Oriented to name.  Frail appearing.  Eyes: Conjunctivae are normal.  EOMI.  PERRLA.  Head: Atraumatic. Nose: No congestion/rhinnorhea. Mouth/Throat: Mucous membranes are dry.   Neck: Normal range of motion.  Cardiovascular: Normal rate, irregular rhythm. Grossly normal heart sounds.  Good peripheral circulation. Respiratory: Normal respiratory effort.  No retractions. Lungs CTAB. Gastrointestinal: Soft and nontender. No distention.  Genitourinary: No flank  tenderness. Musculoskeletal: No lower extremity edema.  Extremities warm and well perfused.  Neurologic:  Normal speech.  Motor intact in all extremities.  No gross focal neurologic deficits are appreciated.  Skin:  Skin is warm and dry. No rash noted. Psychiatric: Unable to assess.  ____________________________________________   LABS (all labs ordered are listed, but only abnormal results are displayed)  Labs Reviewed  BASIC METABOLIC PANEL - Abnormal; Notable for the following components:      Result Value   Glucose, Bld 117 (*)    BUN 25 (*)    All other components within normal limits  URINALYSIS, COMPLETE (UACMP) WITH MICROSCOPIC - Abnormal; Notable for the following components:   Color, Urine YELLOW (*)    APPearance CLEAR (*)    All other components within normal limits  CBC  TROPONIN I  CBG MONITORING, ED   ____________________________________________  EKG  ED ECG REPORT I, Dionne BucySebastian Anabelen Kaminsky, the attending physician, personally viewed and interpreted this ECG.  Date: 09/20/2017 EKG Time: 1433 Rate: 76 Rhythm: Atrial flutter QRS Axis: normal Intervals: Right bundle branch block, left anterior fascicular block ST/T Wave abnormalities: normal Narrative Interpretation: no evidence of acute ischemia; atrial flutter when compared to EKG of 11/03/2015 ____________________________________________  RADIOLOGY  CT head: Increased ventricular size, possible NPH CXR: No focal infiltrate or other acute abnormalities  ____________________________________________   PROCEDURES  Procedure(s) performed: No    Critical Care performed: No ____________________________________________   INITIAL IMPRESSION / ASSESSMENT AND PLAN / ED COURSE  Pertinent labs & imaging results that were available during my care of the patient were reviewed by me and considered in my medical decision making (see chart for details).  83 year old male with past medical history as noted  above presents with worsening generalized weakness and decreased p.o. intake over the last several days, as well as inability to walk due to weakness today.  Review of past medical records in Epic is noncontributory.  [facility] am [facility], and cannot confirm history of dementia and asthma/COPD.  There is no prior documentation of atrial fibrillation or atrial flutter.  [facility].  Patient's caregiver reports [facility] bleed from PUD which was prompted by dark stools, but she states his stools are now normal.  On exam, patient is elderly and frail-appearing.  He is arousable to voice and is at his baseline mental status per the family members.  Neuro exam is nonfocal, but he has dry mucous membranes and is in atrial flutter.  [place] includes worsening of chronic dementia, versus acute cause such as weakness due to the atrial flutter and possible decreased cardiac output, versus dehydration, other metabolic cause, infection, or less likely other cardiac or CNS cause.  Plan for labs, cardiac enzymes, infection workup, CT head, and  reassess.  Patient is borderline hypotensive so we will give fluids as well.  Anticipate the patient likely will need admission given that this appears to be new onset atrial flutter with associated hypotension and weakness.    ----------------------------------------- 5:54 PM on 09/20/2017 -----------------------------------------  On reassessment, patient's blood pressure has improved after 500 cc of fluid and is now in normal range.  Patient's heart rate has varied over the time of his ED stay, but given that he is maintaining normal blood pressure, this intermittent bradycardia does not appear to be acutely dangerous.  On further history from family members, patient does have history of atrial fibrillation but his primary care doctor has elected not to actively treat  it given patient's age, dementia, and poor functional status.  The ED workup is generally negative; CBC and chemistry are within normal limits, the UA shows no signs of UTI or other acute pathology, chest x-ray is negative, and troponin is also negative.  CT shows chronic ischemic changes as well as ventricular dilation which could be due to hydrocephalus.  This may be contributing to patient's decreased functioning and poor ambulation.  However at this time, the workup has not revealed any acute reversible cause of patient's increased weakness, and there is no indication for further acute intervention or admission to the hospital.    I extensively discussed the findings of the workup with patient's family members.  I offered to keep patient in the ED for social work and PT evaluations if they felt that he was not safe at home.  Patient's family members and primary caregiver however states that they do feel patient is safe to be at home in his current state, and he has nursing care as well as physical therapy at home.  He is scheduled for a physical therapy session at home tomorrow.  They would prefer to take him home.  Their only concern is with physically getting the patient from a vehicle to the house.  Given patient's inability to ambulate, he does potentially qualify for EMS transport to home for medical necessity.  Family members feel comfortable with this plan.  Return precautions given, and family expresses understanding.  Patient will follow up with his primary care doctor.  ____________________________________________   FINAL CLINICAL IMPRESSION(S) / ED DIAGNOSES  Final diagnoses:  Generalized weakness      NEW MEDICATIONS STARTED DURING THIS VISIT:  This SmartLink is deprecated. Use AVSMEDLIST instead to display the medication list for a patient.   Note:  This document was prepared using Dragon voice recognition software and may include unintentional dictation errors.     Dionne Bucy, MD 09/20/17 [phone]

## 2017-09-20 NOTE — ED Notes (Signed)
Pt discharge to home via EMS.  Discharge instructions reviewed with family prior to patient discharge.

## 2017-09-20 NOTE — ED Notes (Signed)
Per caregiver, Jimmy Johnson, she spoke with patient's daughter in law and per daughter in law, PCP Dr. Ellis SavageKiser aware of pt's afib x2 years, but decided not to treat due to patient's age.  Also per daughter in law, pt is to be a DNR.  EDP notified and acknowledged information at this time.

## 2017-09-20 NOTE — ED Triage Notes (Signed)
Pt brought in by [facility] from home with "family".  Per "family" pt has been weaker over the past few days.  Per EMS, FD was called out because pt slipped out of bed and "family" could not get him up.  Per EMS, pt has dementia and is at baseline mentality.

## 2018-08-10 DEATH — deceased
